# Patient Record
Sex: Male | Born: 1955 | Race: White | Hispanic: No | Marital: Married | Smoking: Never smoker
Health system: Southern US, Community
[De-identification: ages and names within clinical notes are randomized; demographics above are authoritative.]

## PROBLEM LIST (undated history)

## (undated) DIAGNOSIS — L570 Actinic keratosis: Secondary | ICD-10-CM

## (undated) HISTORY — DX: Actinic keratosis: L57.0

## (undated) HISTORY — PX: FRACTURE SURGERY: SHX138

## (undated) HISTORY — PX: VASECTOMY: SHX75

---

## 2009-02-20 ENCOUNTER — Emergency Department (HOSPITAL_COMMUNITY): Admission: EM | Admit: 2009-02-20 | Discharge: 2009-02-20 | Payer: Self-pay | Admitting: Family Medicine

## 2010-12-29 LAB — POCT I-STAT, CHEM 8
Creatinine, Ser: 1.1 mg/dL (ref 0.4–1.5)
Glucose, Bld: 131 mg/dL — ABNORMAL HIGH (ref 70–99)
Hemoglobin: 15 g/dL (ref 13.0–17.0)
TCO2: 27 mmol/L (ref 0–100)

## 2017-12-14 ENCOUNTER — Ambulatory Visit: Payer: Self-pay | Admitting: Family Medicine

## 2017-12-21 ENCOUNTER — Encounter: Payer: Self-pay | Admitting: Family Medicine

## 2017-12-21 ENCOUNTER — Ambulatory Visit (INDEPENDENT_AMBULATORY_CARE_PROVIDER_SITE_OTHER): Payer: Commercial Managed Care - PPO | Admitting: Family Medicine

## 2017-12-21 VITALS — BP 122/73 | HR 73 | Temp 97.5°F | Ht 72.0 in | Wt 185.2 lb

## 2017-12-21 DIAGNOSIS — Z1159 Encounter for screening for other viral diseases: Secondary | ICD-10-CM

## 2017-12-21 DIAGNOSIS — Z125 Encounter for screening for malignant neoplasm of prostate: Secondary | ICD-10-CM

## 2017-12-21 DIAGNOSIS — L570 Actinic keratosis: Secondary | ICD-10-CM | POA: Insufficient documentation

## 2017-12-21 DIAGNOSIS — Z1211 Encounter for screening for malignant neoplasm of colon: Secondary | ICD-10-CM | POA: Diagnosis not present

## 2017-12-21 DIAGNOSIS — Z13220 Encounter for screening for lipoid disorders: Secondary | ICD-10-CM

## 2017-12-21 DIAGNOSIS — Z7689 Persons encountering health services in other specified circumstances: Secondary | ICD-10-CM | POA: Diagnosis not present

## 2017-12-21 DIAGNOSIS — K594 Anal spasm: Secondary | ICD-10-CM

## 2017-12-21 DIAGNOSIS — Z13228 Encounter for screening for other metabolic disorders: Secondary | ICD-10-CM | POA: Diagnosis not present

## 2017-12-21 DIAGNOSIS — Z130 Encounter for screening for diseases of the blood and blood-forming organs and certain disorders involving the immune mechanism: Secondary | ICD-10-CM | POA: Diagnosis not present

## 2017-12-21 DIAGNOSIS — Z114 Encounter for screening for human immunodeficiency virus [HIV]: Secondary | ICD-10-CM

## 2017-12-21 NOTE — Patient Instructions (Addendum)
You had cryotherapy done today for your actinic keratosis.  You may need another treatment in the next couple of months if these persist.  Make sure to wear protective clothing/ head gear and sunscreen.  You had labs performed today.  You will be contacted with the results of the labs once they are available, usually in the next 3 days for routine lab work.  I have referred you to Gastroenterology for colonoscopy.   Actinic Keratosis An actinic keratosis is a precancerous growth on the skin. This means that it could develop into skin cancer if it is not treated. About 1% of these growths (actinic keratoses) turn into skin cancer within one year if they are not treated. It is important to have all of these growths evaluated to determine the best treatment approach. What are the causes? This condition is caused by getting too much ultraviolet (UV) radiation from the sun or other UV light sources. What increases the risk? The following factors may make you more likely to develop this condition:  Having light-colored skin and blue eyes.  Having blonde or red hair.  Spending a lot of time in the sun.  Inadequate skin protection when outdoors. This may include: ? Not using sunscreen properly. ? Not covering up skin that is exposed to sunlight.  Aging. The risk of developing an actinic keratosis increases with age.  What are the signs or symptoms? Actinic keratoses look like scaly, rough spots of skin.They can be as small as a pinhead or as big as a quarter. They may itch, hurt, or feel sensitive. In most cases, the growths become red. In some cases, they may be skin-colored, light tan, dark tan, pink, or a combination of any of these colors. There may be a small piece of pink or gray skin (skin tag) growing from the actinic keratosis. In some cases, it may be easier to notice actinic keratoses by feeling them, rather than seeing them. Actinic keratoses appear most often on areas of skin that  get a lot of sun exposure, including the scalp, face, ears, lips, upper back, forearms, and the backs of the hands. Sometimes, actinic keratoses disappear, but many reappear a few days to a few weeks later. How is this diagnosed? This condition is usually diagnosed with a physical exam. A tissue sample may be removed from the actinic keratosis and examined under a microscope (biopsy). How is this treated?  Treatment for this condition may include:  Scraping off the actinic keratosis (curettage).  Freezing the actinic keratosis with liquid nitrogen (cryosurgery). This causes the growth to eventually fall off the skin.  Applying medicated creams or gels to destroy the cells in the growth.  Applying chemicals to the actinic keratosis to make the outer layers of skin peel off (chemical peel).  Photodynamic therapy. In this procedure, medicated cream is applied to the actinic keratosis. This cream increases your skin's sensitivity to light. Then, a strong light is aimed at the actinic keratosis to destroy cells in the growth.  Follow these instructions at home: Skin care  Apply cool, wet cloths (cool compresses) to the affected areas.  Do not scratch your skin.  Check your skin regularly for any growths, especially growths that: ? Start to itch or bleed. ? Change in size, shape, or color. Caring for the treated area  Keep the treated area clean and dry as told by your health care provider.  Do not apply any medicine, cream, or lotion to the treated area unless your  health care provider tells you to do that.  Do not pick at blisters or try to break them open. This can cause infection and scarring.  If you have red or irritated skin after treatment, follow instructions from your health care provider about how to take care of the treated area. Make sure you: ? Wash your hands with soap and water before you change your bandage (dressing). If soap and water are not available, use hand  sanitizer. ? Change your dressing as told by your health care provider.  If you have red or irritated skin after treatment, check your treated area every day for signs of infection. Check for: ? Swelling, pain, or more redness. ? Fluid or blood. ? Warmth. ? Pus or a bad smell. General instructions  Take over-the-counter and prescription medicines only as told by your health care provider.  Return to your normal activities as told by your health care provider. Ask your health care provider what activities are safe for you.  Do not use any tobacco products, such as cigarettes, chewing tobacco, and e-cigarettes. If you need help quitting, ask your health care provider.  Have a skin exam done every year by a health care provider who is a skin conditions specialist (dermatologist).  Keep all follow-up visits as told by your health care provider. This is important. How is this prevented?  Do not get sunburns.  Try to avoid the sun between 10:00 a.m. and 4:00 p.m. This is when the UV light is the strongest.  Use a sunscreen or sunblock with SPF 30 (sun protection factor 30) or greater.  Apply sunscreen before you are exposed to sunlight, and reapply periodically as often as directed by the instructions on the sunscreen container.  Always wear sunglasses that have UV protection, and always wear hats and clothing to protect your skin from sunlight.  When possible, avoid medicines that increase your sensitivity to sunlight. These include: ? Certain antibiotic medicines. ? Certain water pills (diuretics). ? Certain prescription medicines that are used to treat acne (retinoids).  Do not use tanning beds or other indoor tanning devices. Contact a health care provider if:  You notice any changes or new growths on your skin.  You have swelling, pain, or more redness around your treated area.  You have fluid or blood coming from your treated area.  Your treated area feels warm to the  touch.  You have pus or a bad smell coming from your treated area.  You have a fever.  You have a blister that becomes large and painful. This information is not intended to replace advice given to you by your health care provider. Make sure you discuss any questions you have with your health care provider. Document Released: 12/04/2008 Document Revised: 05/08/2016 Document Reviewed: 05/18/2015 Elsevier Interactive Patient Education  2018 [employer].  Cryosurgery for Skin Conditions, Care After These instructions give you information on caring for yourself after your procedure. Your doctor may also give you more specific instructions. Call your doctor if you have any problems or questions after your procedure. Follow these instructions at home: Caring for the treated area  Follow instructions from your doctor about how to take care of the treated area. Make sure you: ? Keep the area covered with a bandage (dressing) until it heals, or for as long as told by your doctor. ? Wash your hands with soap and water before you change your bandage. If you do not have soap and water, use hand sanitizer. ?  Change your bandage as told by your doctor. ? Keep the bandage and the treated area clean and dry. If the bandage gets wet, change it right away. ? Clean the treated area with soap and water.  Check the treated area every day for signs of infection. Check for: ? More redness, swelling, or pain. ? More fluid or blood. ? Warmth. ? Pus or a bad smell. General instructions  Do not pick at your blister. Do not try to break it open. This can cause infection and scarring.  Do not put any medicine, cream, or lotion on the treated area unless told by your doctor.  Take over-the-counter and prescription medicines only as told by your doctor.  Keep all follow-up visits as told by your doctor. This is important. Contact a doctor if:  You have more redness, swelling, or pain around the treated  area.  You have more fluid or blood coming from the treated area.  The treated area feels warm to the touch.  You have pus or a bad smell coming from the treated area.  Your blister gets large and painful. Get help right away if:  You have a fever and have redness spreading from the treated area. Summary  You should keep the treated area and your bandage clean and dry.  Check the treated area every day for signs of infection. Signs include fluid, pus, warmth, or having more redness, swelling, or pain.  Do not pick at your blister. Do not try to break it open. This information is not intended to replace advice given to you by your health care provider. Make sure you discuss any questions you have with your health care provider. Document Released: 11/30/2011 Document Revised: 07/27/2016 Document Reviewed: 07/27/2016 Elsevier Interactive Patient Education  2017 [place] Maintenance, Male A healthy lifestyle and preventive care is important for your health and wellness. Ask your health care provider about what schedule of regular examinations is right for you. What should I know about weight and diet? Eat a Healthy Diet  Eat plenty of vegetables, fruits, whole grains, low-fat dairy products, and lean protein.  Do not eat a lot of foods high in solid fats, added sugars, or salt.  Maintain a Healthy Weight Regular exercise can help you achieve or maintain a healthy weight. You should:  Do at least 150 minutes of exercise each week. The exercise should increase your heart rate and make you sweat (moderate-intensity exercise).  Do strength-training exercises at least twice a week.  Watch Your Levels of Cholesterol and Blood Lipids  Have your blood tested for lipids and cholesterol every 5 years starting at 62 years of age. If you are at high risk for heart disease, you should start having your blood tested when you are 62 years old. You may need to have your cholesterol  levels checked more often if: ? Your lipid or cholesterol levels are high. ? You are older than 62 years of age. ? You are at high risk for heart disease.  What should I know about cancer screening? Many types of cancers can be detected early and may often be prevented. Lung Cancer  You should be screened every year for lung cancer if: ? You are a current smoker who has smoked for at least 30 years. ? You are a former smoker who has quit within the past 15 years.  Talk to your health care provider about your screening options, when you should start screening, and how often  you should be screened.  Colorectal Cancer  Routine colorectal cancer screening usually begins at 62 years of age and should be repeated every 5-10 years until you are 62 years old. You may need to be screened more often if early forms of precancerous polyps or small growths are found. Your health care provider may recommend screening at an earlier age if you have risk factors for colon cancer.  Your health care provider may recommend using home test kits to check for hidden blood in the stool.  A small camera at the end of a tube can be used to examine your colon (sigmoidoscopy or colonoscopy). This checks for the earliest forms of colorectal cancer.  Prostate and Testicular Cancer  Depending on your age and overall health, your health care provider may do certain tests to screen for prostate and testicular cancer.  Talk to your health care provider about any symptoms or concerns you have about testicular or prostate cancer.  Skin Cancer  Check your skin from head to toe regularly.  Tell your health care provider about any new moles or changes in moles, especially if: ? There is a change in a mole's size, shape, or color. ? You have a mole that is larger than a pencil eraser.  Always use sunscreen. Apply sunscreen liberally and repeat throughout the day.  Protect yourself by wearing long sleeves, pants, a  wide-brimmed hat, and sunglasses when outside.  What should I know about heart disease, diabetes, and high blood pressure?  If you are 56-20 years of age, have your blood pressure checked every 3-5 years. If you are 36 years of age or older, have your blood pressure checked every year. You should have your blood pressure measured twice-once when you are at a hospital or clinic, and once when you are not at a hospital or clinic. Record the average of the two measurements. To check your blood pressure when you are not at a hospital or clinic, you can use: ? An automated blood pressure machine at a pharmacy. ? A home blood pressure monitor.  Talk to your health care provider about your target blood pressure.  If you are between 65-2 years old, ask your health care provider if you should take aspirin to prevent heart disease.  Have regular diabetes screenings by checking your fasting blood sugar level. ? If you are at a normal weight and have a low risk for diabetes, have this test once every three years after the age of 55. ? If you are overweight and have a high risk for diabetes, consider being tested at a younger age or more often.  A one-time screening for abdominal aortic aneurysm (AAA) by ultrasound is recommended for men aged 28-75 years who are current or former smokers. What should I know about preventing infection? Hepatitis B If you have a higher risk for hepatitis B, you should be screened for this virus. Talk with your health care provider to find out if you are at risk for hepatitis B infection. Hepatitis C Blood testing is recommended for:  Everyone born from [birth year] through 1965.  Anyone with known risk factors for hepatitis C.  Sexually Transmitted Diseases (STDs)  You should be screened each year for STDs including gonorrhea and chlamydia if: ? You are sexually active and are younger than 62 years of age. ? You are older than 62 years of age and your health care provider  tells you that you are at risk for this type of infection. ?  Your sexual activity has changed since you were last screened and you are at an increased risk for chlamydia or gonorrhea. Ask your health care provider if you are at risk.  Talk with your health care provider about whether you are at high risk of being infected with HIV. Your health care provider may recommend a prescription medicine to help prevent HIV infection.  What else can I do?  Schedule regular health, dental, and eye exams.  Stay current with your vaccines (immunizations).  Do not use any tobacco products, such as cigarettes, chewing tobacco, and e-cigarettes. If you need help quitting, ask your health care provider.  Limit alcohol intake to no more than 2 drinks per day. One drink equals 12 ounces of beer, 5 ounces of wine, or 1 ounces of hard liquor.  Do not use street drugs.  Do not share needles.  Ask your health care provider for help if you need support or information about quitting drugs.  Tell your health care provider if you often feel depressed.  Tell your health care provider if you have ever been abused or do not feel safe at home. This information is not intended to replace advice given to you by your health care provider. Make sure you discuss any questions you have with your health care provider. Document Released: 03/05/2008 Document Revised: 05/06/2016 Document Reviewed: 06/11/2015 Elsevier Interactive Patient Education  [employer].

## 2017-12-21 NOTE — Progress Notes (Signed)
Subjective: VH:QIONGEXBM care, Actinic keratosis HPI: Jon Calderon is a 62 y.o. male presenting to clinic today for:  1. Actinic keratoses Patient reports long-standing history of actinic keratosis affecting bilateral temples, bilateral ears and bilateral upper extremities.  He has had dermatology freeze these off in the past.  He notes that new spots have popped up and he is interested in having these removed.  Denies history of melanoma.  No family history of melanoma or other skin cancers.  He notes that he has been quite a bit of time outside as he is a Administrator and a Psychologist, sport and exercise.  He does tend to wear protective clothing and sunscreen but continues to have these spots develop.  No unplanned weight loss, night sweats, spontaneous bleeding of lesions.  2. Rectal pain Patient reports that he had an episode of rectal pain that lasted no longer than 10 minutes and resolved on its own couple of weeks ago.  He notes that pain has not recurred.  Denies any pain with defecation.  No constipation, diarrhea, nausea, vomiting, melena, hematochezia, unplanned weight loss.  No personal or family history of colon cancers.  Patient is actually never had a screening colonoscopy before.  He is interested in getting one done and prefers to go to either [facility] or Dr. Olevia Perches in [place].  3. Prostate cancer screening Patient denies personal history of BPH.  No known family history of BPH or prostate cancer.  He denies nocturia, urinary frequency, difficulty starting a urinary stream, hematuria, low back pain, unplanned weight loss or night sweats.  He notes that he had a manual prostate exam many years ago and was noted that this was normal.  He has never had PSA checked before.  Denies recent ejaculation or sexual activity.   Past Medical History:  Diagnosis Date  . Actinic keratoses      Past Surgical History:  Procedure Laterality Date  . FRACTURE SURGERY     wrist, arm, ribs, foot  . VASECTOMY      Social History   Socioeconomic History  . Marital status: Single    Spouse name: Not on file  . Number of children: 1  . Years of education: Not on file  . Highest education level: Not on file  Occupational History  . Occupation: Owns trucking Systems analyst  Social Needs  . Financial resource strain: Not on file  . Food insecurity:    Worry: Not on file    Inability: Not on file  . Transportation needs:    Medical: Not on file    Non-medical: Not on file  Tobacco Use  . Smoking status: Never Smoker  . Smokeless tobacco: Never Used  Substance and Sexual Activity  . Alcohol use: Never    Frequency: Never  . Drug use: Never  . Sexual activity: Not Currently  Lifestyle  . Physical activity:    Days per week: Not on file    Minutes per session: Not on file  . Stress: Not on file  Relationships  . Social connections:    Talks on phone: Not on file    Gets together: Not on file    Attends religious service: Not on file    Active member of club or organization: Not on file    Attends meetings of clubs or organizations: Not on file    Relationship status: Not on file  . Intimate partner violence:    Fear of current or ex partner: Not on file  Emotionally abused: Not on file    Physically abused: Not on file    Forced sexual activity: Not on file  Other Topics Concern  . Not on file  Social History Narrative  . Not on file   No outpatient medications have been marked as taking for the 12/21/17 encounter (Office Visit) with Janora Norlander, DO.   Family History  Problem Relation Age of Onset  . Pancreatic cancer Mother 59  . Heart disease Father 41  . Healthy Sister   . Healthy Brother   . Healthy Son   . Cancer Maternal Grandfather   . Colon cancer Neg Hx   . Breast cancer Neg Hx   . Ovarian cancer Neg Hx   . Prostate cancer Neg Hx    Allergies  Allergen Reactions  . Penicillins      Health Maintenance: reports having had TDap  ROS: Per  HPI  Objective: Office vital signs reviewed. BP 122/73   Pulse 73   Temp (!) 97.5 F (36.4 C) (Oral)   Ht 6' (1.829 m)   Wt 185 lb 3.2 oz (84 kg)   BMI 25.12 kg/m   Physical Examination:  General: Awake, alert, well nourished, No acute distress HEENT: Normal    Neck: No masses palpated. No lymphadenopathy; no carotid bruits.  No JVD.    Ears: Tympanic membranes intact, normal light reflex, no erythema, no bulging    Eyes: PERRLA, extraocular movement in tact, sclera white; wears glasses    nose: nasal turbinates moist, no nasal discharge    Throat: moist mucus membranes, no erythema, no tonsillar exudate.  Airway is patent Cardio: regular rate and rhythm, S1S2 heard, no murmurs appreciated Pulm: clear to auscultation bilaterally, no wheezes, rhonchi or rales; normal work of breathing on room air GI: soft, non-tender, non-distended, bowel sounds present x4, no hepatomegaly, no splenomegaly, no masses Extremities: warm, well perfused, No edema, cyanosis or clubbing; +2 pulses bilaterally MSK: Normal gait and normal station Skin: Multiple hyperkeratotic lesions noted along the left and right temples.  There are singular discrete lesions noted at the apex of bilateral helix.  He also has a 2 mm x 2 mm lesion along the left upper forearm and a 2.5 mm x by 2.5 mm lesion lower on the left forearm.  Right forearm with a 2 mm x 2 mm lesion.  No associated erythema, induration or fluctuance.  No exudate or bleeding. Neuro: Follows all commands.  No focal neurologic deficits. Psych: Mood stable, speech normal, affect appropriate, pleasant, engaging    Office Visit from 12/21/2017 in [place]  PHQ-2 Total Score  0     Cryotherapy Procedure:  Risks and benefits of procedure were reviewed with the patient.  Written consent obtained and scanned into the chart.  Lesion of concern was identified and located on right temple.  Liquid nitrogen was applied to area of concern  and extending out 2.5 millimeters beyond the border of the lesion.  Treated area was allowed to come back to room temperature before treating it a second time.  Patient tolerated procedure well and there were no immediate complications.  Home care instructions were reviewed with the patient and a handout was provided.  Cryotherapy Procedure:  Risks and benefits of procedure were reviewed with the patient.  Written consent obtained and scanned into the chart.  Lesion of concern was identified and located on left temple.  Liquid nitrogen was applied to area of concern and extending out 2.5  millimeters beyond the border of the lesion.  Treated area was allowed to come back to room temperature before treating it a second time.  Patient tolerated procedure well and there were no immediate complications.  Home care instructions were reviewed with the patient and a handout was provided.  Cryotherapy Procedure:  Risks and benefits of procedure were reviewed with the patient.  Written consent obtained and scanned into the chart.  Lesion of concern was identified and located on right helix.  Liquid nitrogen was applied to area of concern and extending out 1.5 millimeters beyond the border of the lesion.  Treated area was allowed to come back to room temperature before treating it a second time.  Patient tolerated procedure well and there were no immediate complications.  Home care instructions were reviewed with the patient and a handout was provided.  Cryotherapy Procedure:  Risks and benefits of procedure were reviewed with the patient.  Written consent obtained and scanned into the chart.  Lesion of concern was identified and located on left helix.  Liquid nitrogen was applied to area of concern and extending out 1.5 millimeters beyond the border of the lesion.  Treated area was allowed to come back to room temperature before treating it a second time.  Patient tolerated procedure well and there were no  immediate complications.  Home care instructions were reviewed with the patient and a handout was provided.  Cryotherapy Procedure:  Risks and benefits of procedure were reviewed with the patient.  Written consent obtained and scanned into the chart.  Lesion of concern was identified and located on right forearm.  Liquid nitrogen was applied to area of concern and extending out 2.5 millimeters beyond the border of the lesion.  Treated area was allowed to come back to room temperature before treating it a second time.  Patient tolerated procedure well and there were no immediate complications.  Home care instructions were reviewed with the patient and a handout was provided.  Cryotherapy Procedure:  Risks and benefits of procedure were reviewed with the patient.  Written consent obtained and scanned into the chart.  Lesion of concern was identified and located on left forearm x 2.  Liquid nitrogen was applied to area of concern and extending out 2.5 millimeters beyond the border of each lesion.  Treated area was allowed to come back to room temperature before treating it a second time.  Patient tolerated procedure well and there were no immediate complications.  Home care instructions were reviewed with the patient and a handout was provided.  Assessment/ Plan: 62 y.o. male   1. Actinic keratosis of right temple Treated with cryotherapy see above procedure note.  Patient tolerated procedure well.  Home care instructions were reviewed and handout was provided.  Follow-up as needed.  2. Establishing care with new doctor, encounter for  3. Actinic keratosis of left temple Cryotherapy as above.  4. Multiple actinic keratoses Cryotherapy as above.  5. Screening for lipid disorders - Lipid Panel  6. Screening for metabolic disorder - [id number]  7. Screening for malignant neoplasm of prostate No internal prostate exam performed today as patient is asymptomatic.  Will check PSA. - PSA  8.  Screening for deficiency anemia - CBC with Differential  9. Screening for HIV (human immunodeficiency virus) - HIV antibody  10. Encounter for hepatitis C screening test for low risk patient - Hepatitis C antibody  11. Screening for malignant neoplasm of colon - Ambulatory referral to Gastroenterology  12. Proctalgia fugax Reassurance.  No interventions needed.   Janora Norlander, DO [place] [phone]

## 2017-12-22 LAB — CMP14+EGFR
ALK PHOS: 79 IU/L (ref 39–117)
ALT: 19 IU/L (ref 0–44)
AST: 21 IU/L (ref 0–40)
Albumin/Globulin Ratio: 1.5 (ref 1.2–2.2)
Albumin: 4.4 g/dL (ref 3.6–4.8)
BUN/Creatinine Ratio: 22 (ref 10–24)
BUN: 20 mg/dL (ref 8–27)
Bilirubin Total: 0.4 mg/dL (ref 0.0–1.2)
CALCIUM: 9.7 mg/dL (ref 8.6–10.2)
CO2: 24 mmol/L (ref 20–29)
Chloride: 100 mmol/L (ref 96–106)
Creatinine, Ser: 0.89 mg/dL (ref 0.76–1.27)
GFR calc Af Amer: 107 mL/min/{1.73_m2} (ref >59)
GFR, EST NON AFRICAN AMERICAN: 92 mL/min/{1.73_m2} (ref >59)
GLOBULIN, TOTAL: 3 g/dL (ref 1.5–4.5)
Glucose: 94 mg/dL (ref 65–99)
POTASSIUM: 4.5 mmol/L (ref 3.5–5.2)
SODIUM: 139 mmol/L (ref 134–144)
Total Protein: 7.4 g/dL (ref 6.0–8.5)

## 2017-12-22 LAB — CBC WITH DIFFERENTIAL/PLATELET
BASOS ABS: 0 10*3/uL (ref 0.0–0.2)
BASOS: 0 %
EOS (ABSOLUTE): 0.1 10*3/uL (ref 0.0–0.4)
Eos: 1 %
Hematocrit: 42.6 % (ref 37.5–51.0)
Hemoglobin: 15.1 g/dL (ref 13.0–17.7)
IMMATURE GRANS (ABS): 0 10*3/uL (ref 0.0–0.1)
IMMATURE GRANULOCYTES: 0 %
LYMPHS: 22 %
Lymphocytes Absolute: 1.6 10*3/uL (ref 0.7–3.1)
MCH: 31.9 pg (ref 26.6–33.0)
MCHC: 35.4 g/dL (ref 31.5–35.7)
MCV: 90 fL (ref 79–97)
Monocytes Absolute: 0.5 10*3/uL (ref 0.1–0.9)
Monocytes: 7 %
Neutrophils Absolute: 5.2 10*3/uL (ref 1.4–7.0)
Neutrophils: 70 %
PLATELETS: 289 10*3/uL (ref 150–379)
RBC: 4.74 x10E6/uL (ref 4.14–5.80)
RDW: 13.2 % (ref 12.3–15.4)
WBC: 7.4 10*3/uL (ref 3.4–10.8)

## 2017-12-22 LAB — LIPID PANEL
CHOL/HDL RATIO: 4.1 ratio (ref 0.0–5.0)
Cholesterol, Total: 210 mg/dL — ABNORMAL HIGH (ref 100–199)
HDL: 51 mg/dL (ref >39)
LDL Calculated: 150 mg/dL — ABNORMAL HIGH (ref 0–99)
TRIGLYCERIDES: 44 mg/dL (ref 0–149)
VLDL Cholesterol Cal: 9 mg/dL (ref 5–40)

## 2017-12-22 LAB — HEPATITIS C ANTIBODY: Hep C Virus Ab: <0.1 s/co ratio (ref 0.0–0.9)

## 2017-12-22 LAB — HIV ANTIBODY (ROUTINE TESTING W REFLEX): HIV SCREEN 4TH GENERATION: NONREACTIVE

## 2017-12-22 LAB — PSA: Prostate Specific Ag, Serum: 2.6 ng/mL (ref 0.0–4.0)

## 2018-01-31 ENCOUNTER — Ambulatory Visit (INDEPENDENT_AMBULATORY_CARE_PROVIDER_SITE_OTHER): Payer: Self-pay

## 2018-01-31 DIAGNOSIS — Z1211 Encounter for screening for malignant neoplasm of colon: Secondary | ICD-10-CM

## 2018-01-31 MED ORDER — NA SULFATE-K SULFATE-MG SULF 17.5-3.13-1.6 GM/177ML PO SOLN: 1.0000 | ORAL | 0 refills | Status: DC

## 2018-01-31 NOTE — Progress Notes (Addendum)
Gastroenterology Pre-Procedure Review  Request Date:01/31/18 Requesting Physician: Ronnie Doss DO at [facility] ( no previous tcs)  PATIENT REVIEW QUESTIONS: The patient responded to the following health history questions as indicated:    1. Diabetes Melitis: no 2. Joint replacements in the past 12 months: no 3. Major health problems in the past 3 months: no 4. Has an artificial valve or MVP: no 5. Has a defibrillator: no 6. Has been advised in past to take antibiotics in advance of a procedure like teeth cleaning: no 7. Family history of colon cancer: no  8. Alcohol Use: no 9. History of sleep apnea: no  10. History of coronary artery or other vascular stents placed within the last 12 months: no 11. History of any prior anesthesia complications: no    MEDICATIONS & ALLERGIES:    Patient reports the following regarding taking any blood thinners:   Plavix? no Aspirin? no Coumadin? no Brilinta? no Xarelto? no Eliquis? no Pradaxa? no Savaysa? no Effient? no  Patient confirms/reports the following medications:  No current outpatient medications on file.   No current facility-administered medications for this visit.     Patient confirms/reports the following allergies:  Allergies  Allergen Reactions  . Penicillins     No orders of the defined types were placed in this encounter.   AUTHORIZATION INFORMATION Primary Insurance: [place],  [place] #: [id number],  Group #: [id number] Pre-Cert / Josem Kaufmann required: no per [facility] at [facility] / Auth #: reference # [id number]   SCHEDULE INFORMATION: Procedure has been scheduled as follows:  Date: 02/28/18, Time: 12:00  Location: [facility]  This Gastroenterology Pre-Precedure Review Form is being routed to the following provider(s): Roseanne Kaufman NP

## 2018-01-31 NOTE — Patient Instructions (Signed)
Jon Calderon  October 26, 1955 MRN: [id number]     Procedure Date: 02/28/18 Time to register: 11:00am Place to register: [facility] Procedure Time: 12:00 Scheduled provider: Barney Drain, MD    PREPARATION FOR COLONOSCOPY WITH SUPREP BOWEL PREP KIT  Note: Suprep Bowel Prep Kit is a split-dose (2day) regimen. Consumption of BOTH 6-ounce bottles is required for a complete prep.  Please notify us immediately if you are diabetic, take iron supplements, or if you are on Coumadin or any other blood thinners.                                                                                                                                                     1 DAY BEFORE PROCEDURE:  DATE: 02/27/18   DAY: Sunday  clear liquids the entire day - NO SOLID FOOD.     At 6:00pm: Complete steps 1 through 4 below, using ONE (1) 6-ounce bottle, before going to bed. Step 1:  Pour ONE (1) 6-ounce bottle of SUPREP liquid into the mixing container.  Step 2:  Add cool drinking water to the 16 ounce line on the container and mix.  Note: Dilute the solution concentrate as directed prior to use. Step 3:  DRINK ALL the liquid in the container. Step 4:  You MUST drink an additional two (2) or more 16 ounce containers of water over the next one (1) hour.   Continue clear liquids only, until midnight. Do not eat or drink anything after midnight. EXCEPTION: If you take medications for your heart, blood pressure, or breathing, you may take these medications with a small amount of clear liquid.   DAY OF PROCEDURE:   DATE: 02/28/18   DAY: Monday    5 hours before your procedure at :7:00am Step 1:  Pour ONE (1) 6-ounce bottle of SUPREP liquid into the mixing container.  Step 2:  Add cool drinking water to the 16 ounce line on the container and mix.  Note: Dilute the solution concentrate as directed prior to use. Step 3:  DRINK ALL the liquid in the container. Step 4:  You MUST drink an additional two (2)  or more 16 ounce containers of water over the next one (1) hour. You MUST complete the final glass of water at least 3 hours before your colonoscopy.   Nothing by mouth past: 9:00am  You may take your morning medications with sip of water unless we have instructed otherwise.    Please see below for Dietary Information.  CLEAR LIQUIDS INCLUDE:  Water Jello (NOT red in color)   Ice Popsicles (NOT red in color)   Tea (sugar ok, no milk/cream) Powdered fruit flavored drinks  Coffee (sugar ok, no milk/cream) Gatorade/ Lemonade/ Kool-Aid  (NOT red in color)   Juice: apple, white grape, white cranberry Soft drinks  Clear  bullion, consomme, broth (fat free beef/chicken/vegetable)  Carbonated beverages (any kind)  Strained chicken noodle soup Hard Candy   Remember: Clear liquids are liquids that will allow you to see your fingers on the other side of a clear glass. Be sure liquids are NOT red in color, and not cloudy, but CLEAR.  DO NOT EAT OR DRINK ANY OF THE FOLLOWING:  Dairy products of any kind   Cranberry juice Tomato juice / V8 juice   Grapefruit juice Orange juice     Red grape juice  Do not eat any solid foods, including such foods as: cereal, oatmeal, yogurt, fruits, vegetables, creamed soups, eggs, bread, crackers, pureed foods in a blender, etc.   HELPFUL HINTS FOR DRINKING PREP SOLUTION:   Make sure prep is extremely cold. Mix and refrigerate the the morning of the prep. You may also put in the freezer.   You may try mixing some Crystal Light or Country Time Lemonade if you prefer. Mix in small amounts; add more if necessary.  Try drinking through a straw  Rinse mouth with water or a mouthwash between glasses, to remove after-taste.  Try sipping on a cold beverage /ice/ popsicles between glasses of prep.  Place a piece of sugar-free hard candy in mouth between glasses.  If you become nauseated, try consuming smaller amounts, or stretch out the time between glasses. Stop  for 30-60 minutes, then slowly start back drinking.     OTHER INSTRUCTIONS  You will need a responsible adult at least 62 years of age to accompany you and drive you home. This person must remain in the waiting room during your procedure. The hospital will cancel your procedure if you do not have a responsible adult with you.   1. Wear loose fitting clothing that is easily removed. 2. Leave jewelry and other valuables at home.  3. Remove all body piercing jewelry and leave at home. 4. Total time from sign-in until discharge is approximately 2-3 hours. 5. You should go home directly after your procedure and rest. You can resume normal activities the day after your procedure. 6. The day of your procedure you should not:  Drive  Make legal decisions  Operate machinery  Drink alcohol  Return to work   You may call the office (Dept: [phone]) before 5:00pm, or page the doctor on call [phone]) after 5:00pm, for further instructions, if necessary.   Insurance Information YOU WILL NEED TO CHECK WITH YOUR INSURANCE COMPANY FOR THE BENEFITS OF COVERAGE YOU HAVE FOR THIS PROCEDURE.  UNFORTUNATELY, NOT ALL INSURANCE COMPANIES HAVE BENEFITS TO COVER ALL OR PART OF THESE TYPES OF PROCEDURES.  IT IS YOUR RESPONSIBILITY TO CHECK YOUR BENEFITS, HOWEVER, WE WILL BE GLAD TO ASSIST YOU WITH ANY CODES YOUR INSURANCE COMPANY MAY NEED.    PLEASE NOTE THAT MOST INSURANCE COMPANIES WILL NOT COVER A SCREENING COLONOSCOPY FOR PEOPLE UNDER THE AGE OF 50  IF YOU HAVE BCBS INSURANCE, YOU MAY HAVE BENEFITS FOR A SCREENING COLONOSCOPY BUT IF POLYPS ARE FOUND THE DIAGNOSIS WILL CHANGE AND THEN YOU MAY HAVE A DEDUCTIBLE THAT WILL NEED TO BE MET. SO PLEASE MAKE SURE YOU CHECK YOUR BENEFITS FOR A SCREENING COLONOSCOPY AS WELL AS A DIAGNOSTIC COLONOSCOPY.

## 2018-02-07 NOTE — Progress Notes (Signed)
Appropriate.

## 2018-02-28 ENCOUNTER — Ambulatory Visit (HOSPITAL_COMMUNITY)
Admission: RE | Admit: 2018-02-28 | Discharge: 2018-02-28 | Disposition: A | Discharge status: Home / Self Care | Payer: Commercial Managed Care - PPO | Source: Ambulatory Visit | Attending: Gastroenterology | Admitting: Gastroenterology

## 2018-02-28 ENCOUNTER — Encounter (HOSPITAL_COMMUNITY): Payer: Self-pay | Admitting: *Deleted

## 2018-02-28 ENCOUNTER — Other Ambulatory Visit: Payer: Self-pay

## 2018-02-28 ENCOUNTER — Encounter (HOSPITAL_COMMUNITY)
Admission: RE | Disposition: A | Discharge status: Home / Self Care | Payer: Self-pay | Source: Ambulatory Visit | Attending: Gastroenterology

## 2018-02-28 DIAGNOSIS — K644 Residual hemorrhoidal skin tags: Secondary | ICD-10-CM | POA: Insufficient documentation

## 2018-02-28 DIAGNOSIS — Z8249 Family history of ischemic heart disease and other diseases of the circulatory system: Secondary | ICD-10-CM | POA: Diagnosis not present

## 2018-02-28 DIAGNOSIS — Z800 Family history of malignant neoplasm of digestive organs: Secondary | ICD-10-CM | POA: Insufficient documentation

## 2018-02-28 DIAGNOSIS — K648 Other hemorrhoids: Secondary | ICD-10-CM | POA: Diagnosis not present

## 2018-02-28 DIAGNOSIS — D123 Benign neoplasm of transverse colon: Secondary | ICD-10-CM | POA: Insufficient documentation

## 2018-02-28 DIAGNOSIS — Z1211 Encounter for screening for malignant neoplasm of colon: Secondary | ICD-10-CM | POA: Diagnosis not present

## 2018-02-28 DIAGNOSIS — Q438 Other specified congenital malformations of intestine: Secondary | ICD-10-CM | POA: Diagnosis not present

## 2018-02-28 HISTORY — PX: COLONOSCOPY: SHX5424

## 2018-02-28 SURGERY — COLONOSCOPY
Anesthesia: Moderate Sedation

## 2018-02-28 MED ORDER — MIDAZOLAM HCL 5 MG/5ML IJ SOLN
INTRAMUSCULAR | Status: AC
  Filled 2018-02-28: qty 10

## 2018-02-28 MED ORDER — MEPERIDINE HCL 100 MG/ML IJ SOLN
INTRAMUSCULAR | Status: DC | PRN
  Administered 2018-02-28 (×3): 25 mg via INTRAVENOUS

## 2018-02-28 MED ORDER — MEPERIDINE HCL 100 MG/ML IJ SOLN
INTRAMUSCULAR | Status: AC
  Filled 2018-02-28: qty 2

## 2018-02-28 MED ORDER — SODIUM CHLORIDE 0.9 % IV SOLN
INTRAVENOUS | Status: DC
  Administered 2018-02-28: 11:00:00 via INTRAVENOUS

## 2018-02-28 MED ORDER — SIMETHICONE 40 MG/0.6ML PO SUSP
ORAL | Status: DC | PRN
  Administered 2018-02-28: 2.5 mL

## 2018-02-28 MED ORDER — MIDAZOLAM HCL 5 MG/5ML IJ SOLN
INTRAMUSCULAR | Status: DC | PRN
  Administered 2018-02-28 (×3): 2 mg via INTRAVENOUS

## 2018-02-28 NOTE — Op Note (Signed)
[facility] Patient Name: Jon Calderon Procedure Date: 02/28/2018 11:38 AM MRN: [id number] Date of Birth: Dec 16, 1955 Attending MD: Barney Drain MD, MD CSN: [id number] Age: 62 Admit Type: Outpatient Procedure:                Colonoscopy WITH COLD SNARE POLYPECTOMY Indications:              Screening for colorectal malignant neoplasm Providers:                Barney Drain MD, MD, Lurline Del, RN, Nelma Rothman,                            Technician Referring MD:             Koleen Distance. Gottschalk Medicines:                Meperidine 75 mg IV, Midazolam 6 mg IV Complications:            No immediate complications. Estimated Blood Loss:     Estimated blood loss was minimal. Procedure:                Pre-Anesthesia Assessment:                           - Prior to the procedure, a History and Physical                            was performed, and patient medications and                            allergies were reviewed. The patient's tolerance of                            previous anesthesia was also reviewed. The risks                            and benefits of the procedure and the sedation                            options and risks were discussed with the patient.                            All questions were answered, and informed consent                            was obtained. Prior Anticoagulants: The patient has                            taken no previous anticoagulant or antiplatelet                            agents. ASA Grade Assessment: I - A normal, healthy                            patient. After reviewing the risks and benefits,  the patient was deemed in satisfactory condition to                            undergo the procedure. After obtaining informed                            consent, the colonoscope was passed under direct                            vision. Throughout the procedure, the patient's                            blood pressure,  pulse, and oxygen saturations were                            monitored continuously. The EC-[id number] ([id number])                            scope was introduced through the anus and advanced                            to the the cecum, identified by appendiceal orifice                            and ileocecal valve. The colonoscopy was somewhat                            difficult due to a tortuous colon. Successful                            completion of the procedure was aided by                            straightening and shortening the scope to obtain                            bowel loop reduction. The patient tolerated the                            procedure well. The quality of the bowel                            preparation was excellent. The ileocecal valve,                            appendiceal orifice, and rectum were photographed. Scope In: 12:02:26 PM Scope Out: 12:14:25 PM Scope Withdrawal Time: 0 hours 9 minutes 56 seconds  Total Procedure Duration: 0 hours 11 minutes 59 seconds  Findings:      A 5 mm polyp was found in the hepatic flexure. The polyp was sessile.       The polyp was removed with a cold snare. Resection and retrieval were       complete.      External and internal hemorrhoids were found. The  hemorrhoids were       moderate.      The recto-sigmoid colon and sigmoid colon were mildly redundant. Impression:               - One 5 mm polyp at the hepatic flexure, removed                            with a cold snare. Resected and retrieved.                           - External and internal hemorrhoids.                           - Redundant LEFT colon. Moderate Sedation:      Moderate (conscious) sedation was administered by the endoscopy nurse       and supervised by the endoscopist. The following parameters were       monitored: oxygen saturation, heart rate, blood pressure, and response       to care. Total physician intraservice time was 38  minutes. Recommendation:           - High fiber diet.                           - Continue present medications.                           - Await pathology results.                           - Repeat colonoscopy in 5-10 years for surveillance.                           - Patient has a contact number available for                            emergencies. The signs and symptoms of potential                            delayed complications were discussed with the                            patient. Return to normal activities tomorrow.                            Written discharge instructions were provided to the                            patient. Procedure Code(s):        --- Professional ---                           [phone], Colonoscopy, flexible; with removal of                            tumor(s), polyp(s), or other lesion(s) by snare  technique                           G0500, Moderate sedation services provided by the                            same physician or other qualified health care                            professional performing a gastrointestinal                            endoscopic service that sedation supports,                            requiring the presence of an independent trained                            observer to assist in the monitoring of the                            patient's level of consciousness and physiological                            status; initial 15 minutes of intra-service time;                            patient age 71 years or older (additional time may                            be reported with [phone], as appropriate)                           [phone], Moderate sedation services provided by the                            same physician or other qualified health care                            professional performing the diagnostic or                            therapeutic service that the sedation supports,                             requiring the presence of an independent trained                            observer to assist in the monitoring of the                            patient's level of consciousness and physiological                            status; each  additional 15 minutes intraservice                            time (List separately in addition to code for                            primary service)                           [phone], Moderate sedation services provided by the                            same physician or other qualified health care                            professional performing the diagnostic or                            therapeutic service that the sedation supports,                            requiring the presence of an independent trained                            observer to assist in the monitoring of the                            patient's level of consciousness and physiological                            status; each additional 15 minutes intraservice                            time (List separately in addition to code for                            primary service) Diagnosis Code(s):        --- Professional ---                           Z12.11, Encounter for screening for malignant                            neoplasm of colon                           D12.3, Benign neoplasm of transverse colon (hepatic                            flexure or splenic flexure)                           K64.8, Other hemorrhoids                           Q43.8, Other specified congenital malformations of  intestine CPT copyright 2017 [employer]. All rights reserved. The codes documented in this report are preliminary and upon coder review may  be revised to meet current compliance requirements. Barney Drain, MD Barney Drain MD, MD 02/28/2018 12:33:12 PM This report has been signed electronically. Number of Addenda: 0

## 2018-02-28 NOTE — Discharge Instructions (Signed)
You had 1 small polyp removed. You have MODERATE EXTERNAL hemorrhoids.   DRINK WATER TO KEEP YOUR URINE LIGHT YELLOW.  FOLLOW A HIGH FIBER DIET. AVOID ITEMS THAT CAUSE BLOATING & GAS. SEE INFO BELOW.  YOUR BIOPSY RESULTS WILL BE AVAILABLE IN 7 DAYS.   Next colonoscopy BETWEEN 2024-2026.    Colonoscopy Care After Read the instructions outlined below and refer to this sheet in the next week. These discharge instructions provide you with general information on caring for yourself after you leave the hospital. While your treatment has been planned according to the most current medical practices available, unavoidable complications occasionally occur. If you have any problems or questions after discharge, call DR. Mariamawit Depaoli, [phone].  ACTIVITY  You may resume your regular activity, but move at a slower pace for the next 24 hours.   Take frequent rest periods for the next 24 hours.   Walking will help get rid of the air and reduce the bloated feeling in your belly (abdomen).   No driving for 24 hours (because of the medicine (anesthesia) used during the test).   You may shower.   Do not sign any important legal documents or operate any machinery for 24 hours (because of the anesthesia used during the test).    NUTRITION  Drink plenty of fluids.   You may resume your normal diet as instructed by your doctor.   Begin with a light meal and progress to your normal diet. Heavy or fried foods are harder to digest and may make you feel sick to your stomach (nauseated).   Avoid alcoholic beverages for 24 hours or as instructed.    MEDICATIONS  You may resume your normal medications.   WHAT YOU CAN EXPECT TODAY  Some feelings of bloating in the abdomen.   Passage of more gas than usual.   Spotting of blood in your stool or on the toilet paper  .  IF YOU HAD POLYPS REMOVED DURING THE COLONOSCOPY:  Eat a soft diet IF YOU HAVE NAUSEA, BLOATING, ABDOMINAL PAIN, OR  VOMITING.    FINDING OUT THE RESULTS OF YOUR TEST Not all test results are available during your visit. DR. Oneida Alar WILL CALL YOU WITHIN 14 DAYS OF YOUR PROCEDUE WITH YOUR RESULTS. Do not assume everything is normal if you have not heard from DR. Baltazar Pekala, CALL HER OFFICE AT [phone].  SEEK IMMEDIATE MEDICAL ATTENTION AND CALL THE OFFICE: [phone] IF:  You have more than a spotting of blood in your stool.   Your belly is swollen (abdominal distention).   You are nauseated or vomiting.   You have a temperature over 101F.   You have abdominal pain or discomfort that is severe or gets worse throughout the day.   High-Fiber Diet A high-fiber diet changes your normal diet to include more whole grains, legumes, fruits, and vegetables. Changes in the diet involve replacing refined carbohydrates with unrefined foods. The calorie level of the diet is essentially unchanged. The Dietary Reference Intake (recommended amount) for adult males is 38 grams per day. For adult females, it is 25 grams per day. Pregnant and lactating women should consume 28 grams of fiber per day. Fiber is the intact part of a plant that is not broken down during digestion. Functional fiber is fiber that has been isolated from the plant to provide a beneficial effect in the body. PURPOSE  Increase stool bulk.   Ease and regulate bowel movements.   Lower cholesterol.   REDUCE RISK OF COLON CANCER  INDICATIONS THAT YOU NEED MORE FIBER  Constipation and hemorrhoids.   Uncomplicated diverticulosis (intestine condition) and irritable bowel syndrome.   Weight management.   As a protective measure against hardening of the arteries (atherosclerosis), diabetes, and cancer.   GUIDELINES FOR INCREASING FIBER IN THE DIET  Start adding fiber to the diet slowly. A gradual increase of about 5 more grams (2 slices of whole-wheat bread, 2 servings of most fruits or vegetables, or 1 bowl of high-fiber cereal) per day is  best. Too rapid an increase in fiber may result in constipation, flatulence, and bloating.   Drink enough water and fluids to keep your urine clear or pale yellow. Water, juice, or caffeine-free drinks are recommended. Not drinking enough fluid may cause constipation.   Eat a variety of high-fiber foods rather than one type of fiber.   Try to increase your intake of fiber through using high-fiber foods rather than fiber pills or supplements that contain small amounts of fiber.   The goal is to change the types of food eaten. Do not supplement your present diet with high-fiber foods, but replace foods in your present diet.   INCLUDE A VARIETY OF FIBER SOURCES  Replace refined and processed grains with whole grains, canned fruits with fresh fruits, and incorporate other fiber sources. White rice, white breads, and most bakery goods contain little or no fiber.   Brown whole-grain rice, buckwheat oats, and many fruits and vegetables are all good sources of fiber. These include: broccoli, Brussels sprouts, cabbage, cauliflower, beets, sweet potatoes, white potatoes (skin on), carrots, tomatoes, eggplant, squash, berries, fresh fruits, and dried fruits.   Cereals appear to be the richest source of fiber. Cereal fiber is found in whole grains and bran. Bran is the fiber-rich outer coat of cereal grain, which is largely removed in refining. In whole-grain cereals, the bran remains. In breakfast cereals, the largest amount of fiber is found in those with "bran" in their names. The fiber content is sometimes indicated on the label.   You may need to include additional fruits and vegetables each day.   In baking, for 1 cup white flour, you may use the following substitutions:   1 cup whole-wheat flour minus 2 tablespoons.   1/2 cup white flour plus 1/2 cup whole-wheat flour.   Polyps, Colon  A polyp is extra tissue that grows inside your body. Colon polyps grow in the large intestine. The large  intestine, also called the colon, is part of your digestive system. It is a long, hollow tube at the end of your digestive tract where your body makes and stores stool. Most polyps are not dangerous. They are benign. This means they are not cancerous. But over time, some types of polyps can turn into cancer. Polyps that are smaller than a pea are usually not harmful. But larger polyps could someday become or may already be cancerous. To be safe, doctors remove all polyps and test them.   WHO GETS POLYPS? Anyone can get polyps, but certain people are more likely than others. You may have a greater chance of getting polyps if:  You are over 50.   You have had polyps before.   Someone in your family has had polyps.   Someone in your family has had cancer of the large intestine.   Find out if someone in your family has had polyps. You may also be more likely to get polyps if you:   Eat a lot of fatty foods  Smoke   Drink alcohol   Do not exercise  Eat too much   PREVENTION There is not one sure way to prevent polyps. You might be able to lower your risk of getting them if you:  Eat more fruits and vegetables and less fatty food.   Do not smoke.   Avoid alcohol.   Exercise every day.   Lose weight if you are overweight.   Eating more calcium and folate can also lower your risk of getting polyps. Some foods that are rich in calcium are milk, cheese, and broccoli. Some foods that are rich in folate are chickpeas, kidney beans, and spinach.   Hemorrhoids Hemorrhoids are dilated (enlarged) veins around the rectum. Sometimes clots will form in the veins. This makes them swollen and painful. These are called thrombosed hemorrhoids. Causes of hemorrhoids include:  Constipation.   Straining to have a bowel movement.   HEAVY LIFTING  HOME CARE INSTRUCTIONS  Eat a well balanced diet and drink 6 to 8 glasses of water every day to avoid constipation. You may also use a bulk laxative.    Avoid straining to have bowel movements.   Keep anal area dry and clean.   Do not use a donut shaped pillow or sit on the toilet for long periods. This increases blood pooling and pain.   Move your bowels when your body has the urge; this will require less straining and will decrease pain and pressure.

## 2018-02-28 NOTE — H&P (Addendum)
Primary Care Physician:  Janora Norlander, DO Primary Gastroenterologist:  Dr. Oneida Alar  Pre-Procedure History & Physical: HPI:  Jon Calderon is a 62 y.o. male here for COLON CANCER SCREENING.  Past Medical History:  Diagnosis Date  . Actinic keratoses     Past Surgical History:  Procedure Laterality Date  . FRACTURE SURGERY     wrist, arm, ribs, foot  . VASECTOMY      Prior to Admission medications   Medication Sig Start Date End Date Taking? Authorizing Provider  Na Sulfate-K Sulfate-Mg Sulf (SUPREP BOWEL PREP KIT) 17.5-3.13-1.6 GM/177ML SOLN Take 1 kit by mouth as directed. 01/31/18  Yes Annitta Needs, NP    Allergies as of 01/31/2018 - Review Complete 01/31/2018  Allergen Reaction Noted  . Penicillins  12/21/2017    Family History  Problem Relation Age of Onset  . Pancreatic cancer Mother 65  . Heart disease Father 68  . Healthy Sister   . Healthy Brother   . Healthy Son   . Cancer Maternal Grandfather   . Colon cancer Neg Hx   . Breast cancer Neg Hx   . Ovarian cancer Neg Hx   . Prostate cancer Neg Hx     Social History   Socioeconomic History  . Marital status: Single    Spouse name: Not on file  . Number of children: 1  . Years of education: Not on file  . Highest education level: Not on file  Occupational History  . Occupation: Owns trucking Systems analyst  Social Needs  . Financial resource strain: Not on file  . Food insecurity:    Worry: Not on file    Inability: Not on file  . Transportation needs:    Medical: Not on file    Non-medical: Not on file  Tobacco Use  . Smoking status: Never Smoker  . Smokeless tobacco: Never Used  Substance and Sexual Activity  . Alcohol use: Never    Frequency: Never  . Drug use: Never  . Sexual activity: Not Currently  Lifestyle  . Physical activity:    Days per week: Not on file    Minutes per session: Not on file  . Stress: Not on file  Relationships  . Social connections:    Talks on  phone: Not on file    Gets together: Not on file    Attends religious service: Not on file    Active member of club or organization: Not on file    Attends meetings of clubs or organizations: Not on file    Relationship status: Not on file  . Intimate partner violence:    Fear of current or ex partner: Not on file    Emotionally abused: Not on file    Physically abused: Not on file    Forced sexual activity: Not on file  Other Topics Concern  . Not on file  Social History Narrative  . Not on file    Review of Systems: See HPI, otherwise negative ROS   Physical Exam: BP 121/85   Pulse 67   Temp 98.8 F (37.1 C) (Oral)   Resp 12   Ht 6' (1.829 m)   Wt 172 lb (78 kg)   SpO2 98%   BMI 23.33 kg/m  General:   Alert,  pleasant and cooperative in NAD Head:  Normocephalic and atraumatic. Neck:  Supple; Lungs:  Clear throughout to auscultation.    Heart:  Regular rate and rhythm. Abdomen:  Soft, nontender and  nondistended. Normal bowel sounds, without guarding, and without rebound.   Neurologic:  Alert and  oriented x4;  grossly normal neurologically.  Impression/Plan:    SCREENING  Plan:  1. TCS TODAY DISCUSSED PROCEDURE, BENEFITS, & RISKS: < 1% chance of medication reaction, bleeding, perforation, or rupture of spleen/liver.

## 2018-03-07 NOTE — Progress Notes (Signed)
CC'D TO PCP °

## 2018-03-08 NOTE — Progress Notes (Signed)
LMOM to call.

## 2018-03-14 ENCOUNTER — Encounter (HOSPITAL_COMMUNITY): Payer: Self-pay | Admitting: Gastroenterology

## 2019-11-16 ENCOUNTER — Other Ambulatory Visit: Payer: Self-pay

## 2019-11-16 ENCOUNTER — Emergency Department (HOSPITAL_BASED_OUTPATIENT_CLINIC_OR_DEPARTMENT_OTHER): Payer: Commercial Managed Care - PPO

## 2019-11-16 ENCOUNTER — Encounter (HOSPITAL_BASED_OUTPATIENT_CLINIC_OR_DEPARTMENT_OTHER): Payer: Self-pay | Admitting: *Deleted

## 2019-11-16 ENCOUNTER — Emergency Department (HOSPITAL_BASED_OUTPATIENT_CLINIC_OR_DEPARTMENT_OTHER)
Admission: EM | Admit: 2019-11-16 | Discharge: 2019-11-16 | Disposition: A | Discharge status: Home / Self Care | Payer: Commercial Managed Care - PPO | Attending: Emergency Medicine | Admitting: Emergency Medicine

## 2019-11-16 DIAGNOSIS — Z20822 Contact with and (suspected) exposure to covid-19: Secondary | ICD-10-CM | POA: Insufficient documentation

## 2019-11-16 DIAGNOSIS — K529 Noninfective gastroenteritis and colitis, unspecified: Secondary | ICD-10-CM | POA: Insufficient documentation

## 2019-11-16 DIAGNOSIS — R112 Nausea with vomiting, unspecified: Secondary | ICD-10-CM | POA: Diagnosis present

## 2019-11-16 LAB — COMPREHENSIVE METABOLIC PANEL
ALT: 16 U/L (ref 0–44)
AST: 20 U/L (ref 15–41)
Albumin: 3.6 g/dL (ref 3.5–5.0)
Alkaline Phosphatase: 64 U/L (ref 38–126)
Anion gap: 9 (ref 5–15)
BUN: 19 mg/dL (ref 8–23)
CO2: 23 mmol/L (ref 22–32)
Calcium: 8.7 mg/dL — ABNORMAL LOW (ref 8.9–10.3)
Chloride: 100 mmol/L (ref 98–111)
Creatinine, Ser: 1.04 mg/dL (ref 0.61–1.24)
GFR calc Af Amer: >60 mL/min (ref >60)
GFR calc non Af Amer: >60 mL/min (ref >60)
Glucose, Bld: 102 mg/dL — ABNORMAL HIGH (ref 70–99)
Potassium: 3.4 mmol/L — ABNORMAL LOW (ref 3.5–5.1)
Sodium: 132 mmol/L — ABNORMAL LOW (ref 135–145)
Total Bilirubin: 0.4 mg/dL (ref 0.3–1.2)
Total Protein: 7.4 g/dL (ref 6.5–8.1)

## 2019-11-16 LAB — CBC WITH DIFFERENTIAL/PLATELET
Abs Immature Granulocytes: 0.03 10*3/uL (ref 0.00–0.07)
Basophils Absolute: 0 10*3/uL (ref 0.0–0.1)
Basophils Relative: 1 %
Eosinophils Absolute: 0 10*3/uL (ref 0.0–0.5)
Eosinophils Relative: 0 %
HCT: 47.1 % (ref 39.0–52.0)
Hemoglobin: 15.9 g/dL (ref 13.0–17.0)
Immature Granulocytes: 0 %
Lymphocytes Relative: 14 %
Lymphs Abs: 1.1 10*3/uL (ref 0.7–4.0)
MCH: 32.1 pg (ref 26.0–34.0)
MCHC: 33.8 g/dL (ref 30.0–36.0)
MCV: 95 fL (ref 80.0–100.0)
Monocytes Absolute: 1.1 10*3/uL — ABNORMAL HIGH (ref 0.1–1.0)
Monocytes Relative: 14 %
Neutro Abs: 5.2 10*3/uL (ref 1.7–7.7)
Neutrophils Relative %: 71 %
Platelets: 241 10*3/uL (ref 150–400)
RBC: 4.96 MIL/uL (ref 4.22–5.81)
RDW: 12.1 % (ref 11.5–15.5)
WBC: 7.4 10*3/uL (ref 4.0–10.5)
nRBC: 0 % (ref 0.0–0.2)

## 2019-11-16 LAB — LIPASE, BLOOD: Lipase: 25 U/L (ref 11–51)

## 2019-11-16 LAB — URINALYSIS, ROUTINE W REFLEX MICROSCOPIC
Bilirubin Urine: NEGATIVE
Glucose, UA: NEGATIVE mg/dL
Hgb urine dipstick: NEGATIVE
Ketones, ur: NEGATIVE mg/dL
Leukocytes,Ua: NEGATIVE
Nitrite: NEGATIVE
Protein, ur: NEGATIVE mg/dL
Specific Gravity, Urine: 1.025 (ref 1.005–1.030)
pH: 6 (ref 5.0–8.0)

## 2019-11-16 LAB — SARS CORONAVIRUS 2 AG (30 MIN TAT): SARS Coronavirus 2 Ag: NEGATIVE

## 2019-11-16 MED ORDER — METRONIDAZOLE 500 MG PO TABS: 500.0000 mg | ORAL_TABLET | Freq: Three times a day (TID) | ORAL | 0 refills | Status: DC

## 2019-11-16 MED ORDER — ONDANSETRON 4 MG PO TBDP: 4.0000 mg | ORAL_TABLET | Freq: Three times a day (TID) | ORAL | 0 refills | Status: DC | PRN

## 2019-11-16 MED ORDER — CIPROFLOXACIN HCL 500 MG PO TABS: 500.0000 mg | ORAL_TABLET | Freq: Two times a day (BID) | ORAL | 0 refills | Status: DC

## 2019-11-16 MED ORDER — POTASSIUM CHLORIDE CRYS ER 20 MEQ PO TBCR
40.0000 meq | EXTENDED_RELEASE_TABLET | Freq: Once | ORAL | Status: AC
  Administered 2019-11-16: 40 meq via ORAL
  Filled 2019-11-16: qty 2

## 2019-11-16 MED ORDER — ONDANSETRON HCL 4 MG/2ML IJ SOLN
4.0000 mg | Freq: Once | INTRAMUSCULAR | Status: AC
  Administered 2019-11-16: 12:00:00 4 mg via INTRAVENOUS
  Filled 2019-11-16: qty 2

## 2019-11-16 MED ORDER — SODIUM CHLORIDE 0.9 % IV BOLUS
1000.0000 mL | Freq: Once | INTRAVENOUS | Status: AC
  Administered 2019-11-16: 12:00:00 1000 mL via INTRAVENOUS

## 2019-11-16 MED ORDER — IOHEXOL 300 MG/ML  SOLN
100.0000 mL | Freq: Once | INTRAMUSCULAR | Status: AC | PRN
  Administered 2019-11-16: 100 mL via INTRAVENOUS

## 2019-11-16 MED FILL — metroNIDAZOLE 500 MG TABS: 500 | 7 days supply | Qty: 21 | Fill #0

## 2019-11-16 MED FILL — CIPROFLOXACIN HCL 500 MG TA: 500 | 7 days supply | Qty: 14 | Fill #0

## 2019-11-16 MED FILL — ONDANSETRON ODT 4 MG TABLET: 4 | 3 days supply | Qty: 8 | Fill #0

## 2019-11-16 NOTE — ED Notes (Signed)
Pt transported to CT ?

## 2019-11-16 NOTE — Discharge Instructions (Signed)
Start Cipro and Flagyl for colitis (antibiotics). Take with small amount of food Take Zofran as needed for nausea or vomiting Eat foods rich in potassium since levels were low here Drink plenty of fluids Follow up with your doctor or return if you are worsening

## 2019-11-16 NOTE — ED Triage Notes (Signed)
N/V/D since Monday afternoon

## 2019-11-16 NOTE — ED Provider Notes (Signed)
[facility] Provider Note   CSN: [id number] Arrival date & time: 11/16/19  1106     History Chief Complaint  Patient presents with  . Nausea  . Emesis  . Diarrhea    Jon Calderon is a 64 y.o. male who presents with N/V/D.  Patient states that on Monday he started to have recurrent nausea, vomiting, and multiple episodes of diarrhea.  He estimates he has been having 2-3 episodes of watery diarrhea every hour.  Stool is green in color.  He has been running a low-grade fever of 100.8 at home.  He is also having a throbbing headache, fatigue, generalized weakness.  States that the nausea is a little bit better today but he is having persistent diarrhea and fatigue.  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  Denies any recent hospitalizations or antibiotic use.  [place] and takes care of cows, horses, pigs.  Per chart review pt had a colonoscopy in 2019 which showed polyps and hemorrhoids.  No prior abdominal surgeries.  No URI symptoms, chest pain, shortness of breath, cough.  Urine has been decreased.  HPI     Past Medical History:  Diagnosis Date  . Actinic keratoses     Patient Active Problem List   Diagnosis Date Noted  . Special screening for malignant neoplasms, colon   . Multiple actinic keratoses 12/21/2017  . 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 COLONOSCOPY N/A 02/28/2018   Procedure: COLONOSCOPY;  Surgeon: Danie Binder, MD;  Location: [facility];  Service: Endoscopy;  Laterality: N/A;  12:00  . FRACTURE SURGERY     wrist, arm, ribs, foot  . VASECTOMY         Family History  Problem Relation Age of Onset  . Pancreatic cancer Mother 40  . Heart disease Father 10  . Healthy Sister   . Healthy Brother   . Healthy Son   . Cancer Maternal Grandfather   . Colon cancer Neg Hx   . Breast cancer Neg Hx   . Ovarian cancer Neg Hx   . Prostate  cancer Neg Hx     Social History   Tobacco Use  . Smoking status: Never Smoker  . Smokeless tobacco: Never Used  Substance Use Topics  . Alcohol use: Never  . Drug use: Never    Home Medications Prior to Admission medications   Not on File    Allergies    Penicillins  Review of Systems   Review of Systems  Constitutional: Positive for activity change, fatigue and fever.  HENT: Negative for rhinorrhea and sore throat.   Respiratory: Negative for cough and shortness of breath.   Cardiovascular: Negative for chest pain.  Gastrointestinal: Positive for abdominal pain, diarrhea, nausea and vomiting.  Genitourinary: Positive for decreased urine volume. Negative for difficulty urinating, dysuria and frequency.  Neurological: Positive for headaches.  All other systems reviewed and are negative.   Physical Exam Updated Vital Signs BP 125/89 (BP Location: Right Arm)   Pulse 74   Temp 98.4 F (36.9 C) (Oral)   Resp 16   Ht 6' (1.829 m)   Wt 76.2 kg   SpO2 100%   BMI 22.78 kg/m   Physical Exam Vitals and nursing note reviewed.  Constitutional:      General: He is not in acute distress.    Appearance: Normal appearance. He  is well-developed. He is not ill-appearing.     Comments: Calm and cooperative.  No acute distress.  Fatigued appearing  HENT:     Head: Normocephalic and atraumatic.  Eyes:     General: No scleral icterus.       Right eye: No discharge.        Left eye: No discharge.     Conjunctiva/sclera: Conjunctivae normal.     Pupils: Pupils are equal, round, and reactive to light.  Cardiovascular:     Rate and Rhythm: Normal rate and regular rhythm.  Pulmonary:     Effort: Pulmonary effort is normal. No respiratory distress.     Breath sounds: Normal breath sounds.  Abdominal:     General: There is no distension.     Palpations: Abdomen is soft.     Tenderness: There is no abdominal tenderness.  Musculoskeletal:     Cervical back: Normal range of  motion.  Skin:    General: Skin is warm and dry.  Neurological:     Mental Status: He is alert and oriented to person, place, and time.  Psychiatric:        Behavior: Behavior normal.     ED Results / Procedures / Treatments   Labs (all labs ordered are listed, but only abnormal results are displayed) Labs Reviewed  CBC WITH DIFFERENTIAL/PLATELET - Abnormal; Notable for the following components:      Result Value   Monocytes Absolute 1.1 (*)    All other components within normal limits  COMPREHENSIVE METABOLIC PANEL - Abnormal; Notable for the following components:   Sodium 132 (*)    Potassium 3.4 (*)    Glucose, Bld 102 (*)    Calcium 8.7 (*)    All other components within normal limits  SARS CORONAVIRUS 2 AG (30 MIN TAT)  GI PATHOGEN PANEL BY PCR, STOOL  C DIFFICILE QUICK SCREEN W PCR REFLEX  LIPASE, BLOOD  URINALYSIS, ROUTINE W REFLEX MICROSCOPIC    EKG None  Radiology CT Abdomen Pelvis W Contrast  Result Date: 11/16/2019 CLINICAL DATA:  Nausea and vomiting. Lower abdominal pain. EXAM: CT ABDOMEN AND PELVIS WITH CONTRAST TECHNIQUE: Multidetector CT imaging of the abdomen and pelvis was performed using the standard protocol following bolus administration of intravenous contrast. CONTRAST:  174mL OMNIPAQUE IOHEXOL 300 MG/ML  SOLN COMPARISON:  None. FINDINGS: Lower chest: Minor dependent atelectasis in the lung bases. Heart is normal in size. Hepatobiliary: 12 mm low-density in the left hepatic dome is likely cyst, too small to accurately characterize. No suspicious hepatic lesion. Gallbladder physiologically distended, no calcified stone. No biliary dilatation. Pancreas: No ductal dilatation or inflammation. Spleen: Normal in size without focal abnormality. Adrenals/Urinary Tract: No adrenal nodule. No hydronephrosis or perinephric edema. Homogeneous renal enhancement with symmetric excretion on delayed phase imaging. Multiple right renal cysts, including clustered cyst in  the anterior mid kidney, largest measuring 2.3 cm. No evidence of focal solid lesion. Urinary bladder is physiologically distended without wall thickening. An enhancing 13 mm nodule at the central base of the urinary bladder is likely contiguous with the prostate. Stomach/Bowel: Mild diffuse colonic wall thickening from the cecum to the rectum, most prominent in the right colon. Faint pericolonic edema about the ascending colon. Normal appendix. The terminal ileum is normal. No small bowel obstruction or inflammation. Nondistended stomach. Vascular/Lymphatic: No enlarged lymph nodes in the abdomen or pelvis. Normal caliber abdominal aorta. Minimal aortic atherosclerosis. Circumaortic left renal vein. Portal vein is patent. Mesenteric vessels are patent. Reproductive:  Prominent prostate gland spans 5.4 cm. Enhancing 13 mm nodule at the prostate apex projects into the bladder base. Other: Tiny fat containing umbilical hernia. No free air or free fluid in the abdomen or pelvis. No intra-abdominal abscess. Musculoskeletal: There are no acute or suspicious osseous abnormalities. Degenerative change in the lower lumbar spine with primarily facet hypertrophy. IMPRESSION: 1. Mild diffuse colonic wall thickening from the cecum to the rectum, most prominent in the right colon. Findings suspicious for colitis, likely infectious or inflammatory. 2. Enhancing 13 mm nodule in the central bladder base is likely contiguous with the prostate, and likely a prostate nodule. Prominent sized prostate gland. Recommend correlation with PSA and correlation with physical exam. 3. Incidental right renal cysts. Aortic Atherosclerosis (ICD10-I70.0). Electronically Signed   By: Keith Rake M.D.   On: 11/16/2019 13:25    Procedures Procedures (including critical care time)  Medications Ordered in ED Medications  sodium chloride 0.9 % bolus 1,000 mL (0 mLs Intravenous Stopped 11/16/19 1328)  ondansetron (ZOFRAN) injection 4 mg (4 mg  Intravenous Given 11/16/19 1156)  iohexol (OMNIPAQUE) 300 MG/ML solution 100 mL (100 mLs Intravenous Contrast Given 11/16/19 1257)  potassium chloride SA (KLOR-CON) CR tablet 40 mEq (40 mEq Oral Given 11/16/19 1402)    ED Course  I have reviewed the triage vital signs and the nursing notes.  Pertinent labs & imaging results that were available during my care of the patient were reviewed by me and considered in my medical decision making (see chart for details).  64 year old male presents with nausea, vomiting, diarrhea for the past 4 days.  Symptoms are slowly improving but are still persistent.  On exam he is fatigued appearing.  Heart is regular rate and rhythm.  Lungs are clear to auscultation.  Abdomen is soft and nontender.  Differential includes gastroenteritis/colitis, gastritis, viral infection such as Covid, appendicitis, diverticulitis.  Will obtain labs, urine, CT abdomen pelvis.  Will give a liter of fluids.  CBC is normal.  CMP is remarkable for mild hyponatremia and hypokalemia.  Kidney function is normal.  Lipase is normal.  Urine is negative.  POC COVID is negative. CT abdomen pelvis shows diffuse colitis.  Patient was unable to give a stool sample here.  Treat empirically with Cipro and Flagyl.  He is given a dose of potassium and tolerated well.  Give him prescription for Zofran as well and advised follow-up with his primary care doctor or return if worsening.  MDM Rules/Calculators/A&P                       Final Clinical Impression(s) / ED Diagnoses Final diagnoses:  Colitis    Rx / DC Orders ED Discharge Orders    None       Recardo Evangelist, PA-C 11/16/19 [place], [place], DO 11/17/19 0725

## 2021-10-22 IMAGING — CT CT ABD-PELV W/ CM
2 of 5 series · 15 of 46 positions shown, 17 images · IV contrast (omnipaque)
Comparison: None.

CLINICAL DATA: Nausea and vomiting. Lower abdominal pain.

EXAM:
CT ABDOMEN AND PELVIS WITH CONTRAST
TECHNIQUE: Multidetector CT imaging of the abdomen and pelvis was performed
using the standard protocol following bolus administration of
intravenous contrast.
CONTRAST:  100mL OMNIPAQUE IOHEXOL 300 MG/ML  SOLN

[Series 2: axial st · axial · 0.81mm/px · z∈[-603,-123]mm · 12 of 110 slices shown, 14 images]
[im 7/110  soft-tissue]
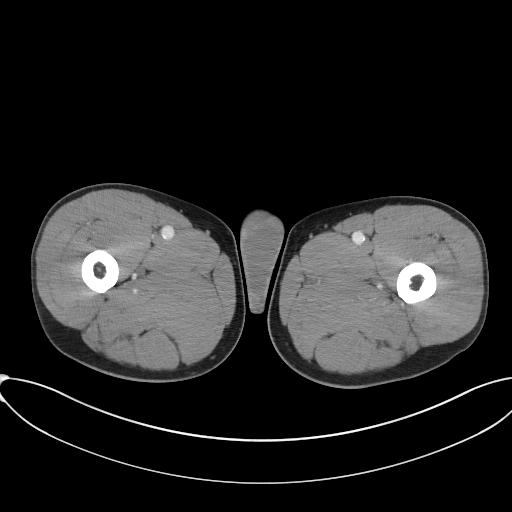
[im 7/110  bone]
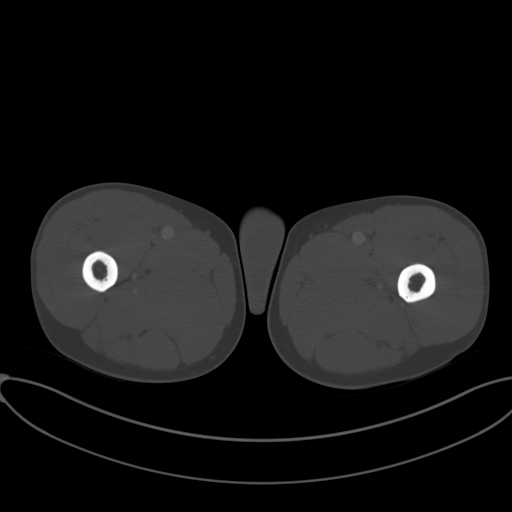
[im 19/110  soft-tissue]
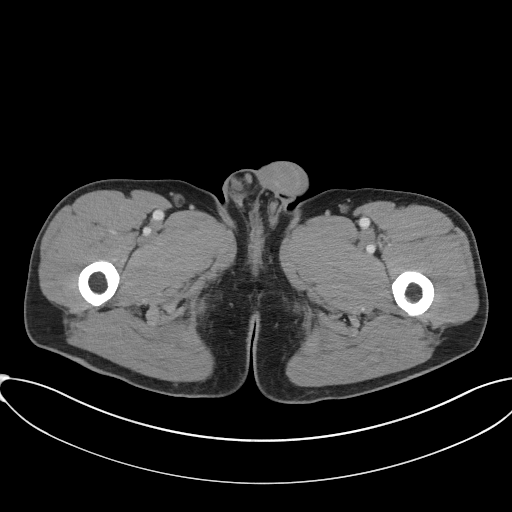
[im 25/110  soft-tissue]
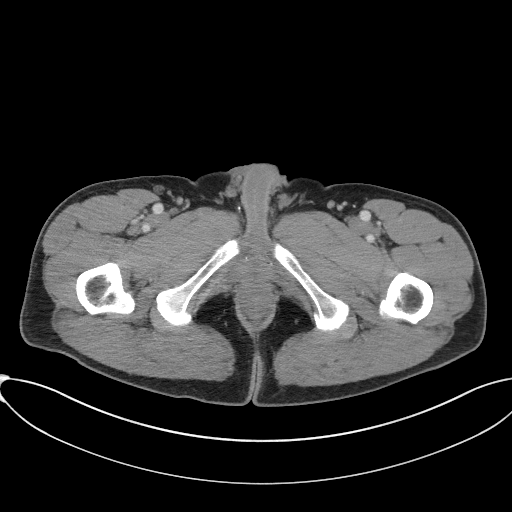
[im 31/110  soft-tissue]
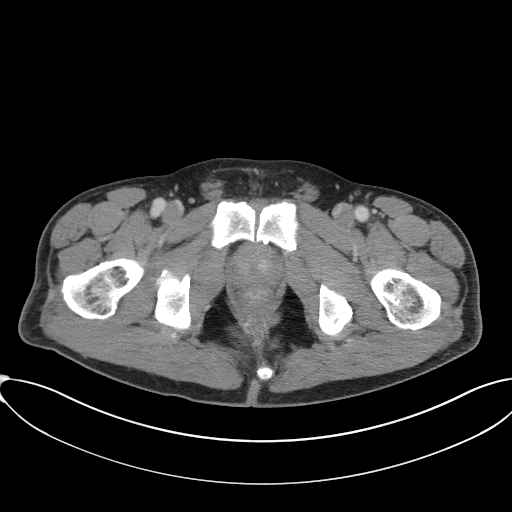
[im 43/110  soft-tissue]
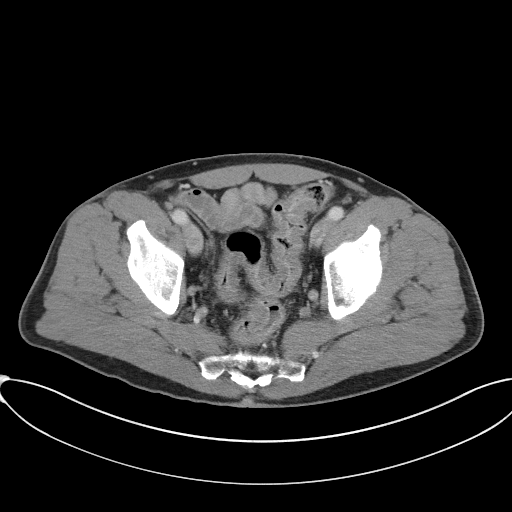
[im 49/110  soft-tissue]
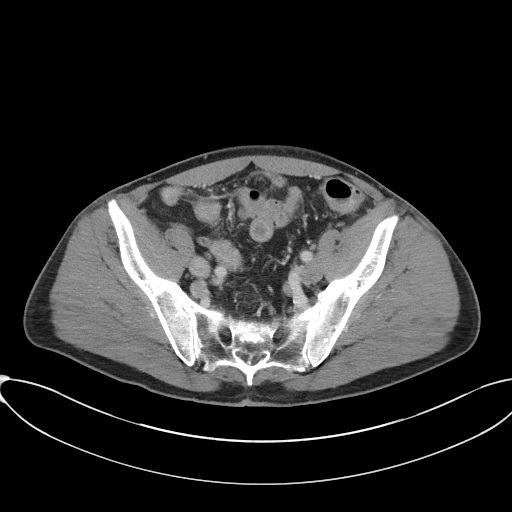
[im 61/110  soft-tissue]
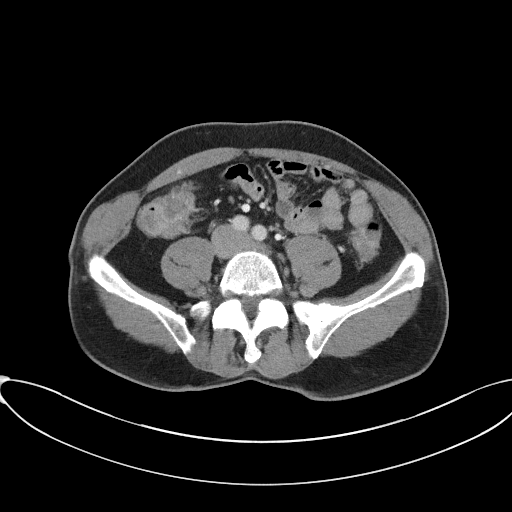
[im 67/110  soft-tissue]
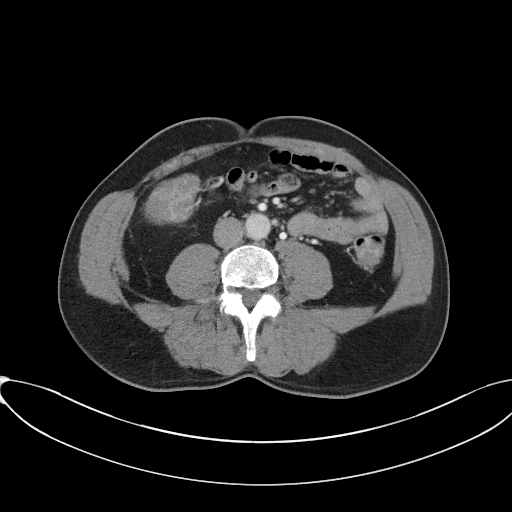
[im 79/110  soft-tissue]
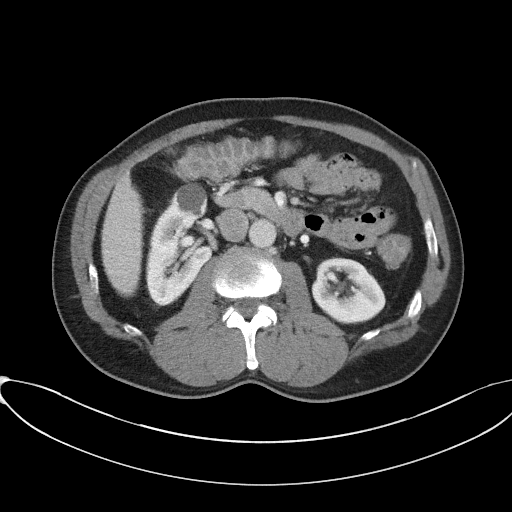
[im 79/110  bone]
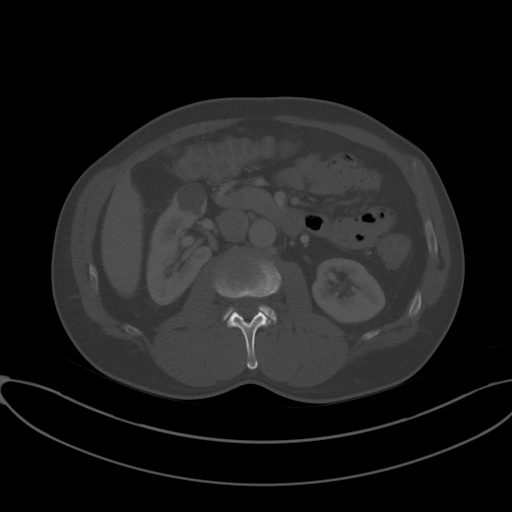
[im 85/110  soft-tissue]
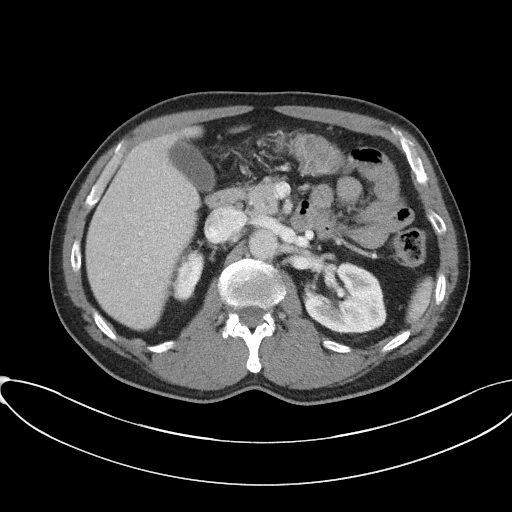
[im 91/110  soft-tissue]
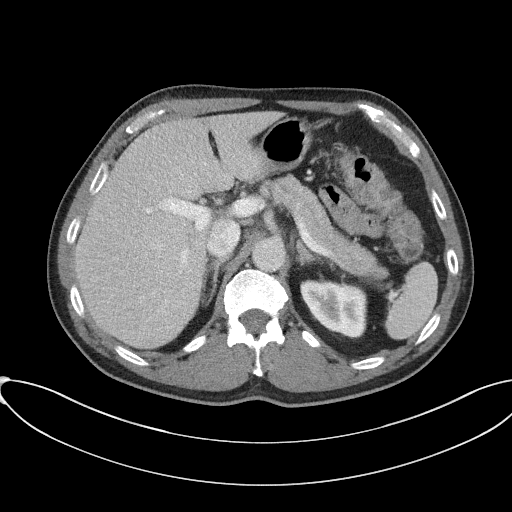
[im 103/110  soft-tissue]
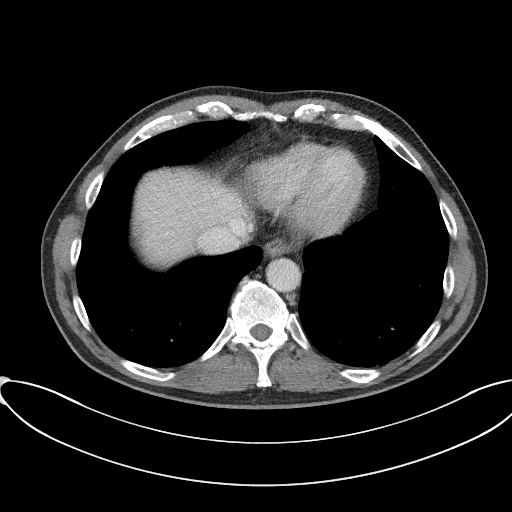

[Series 5: coronal st · coronal · 0.81mm/px · 3 of 89 slices shown]
[im 30/89  soft-tissue]
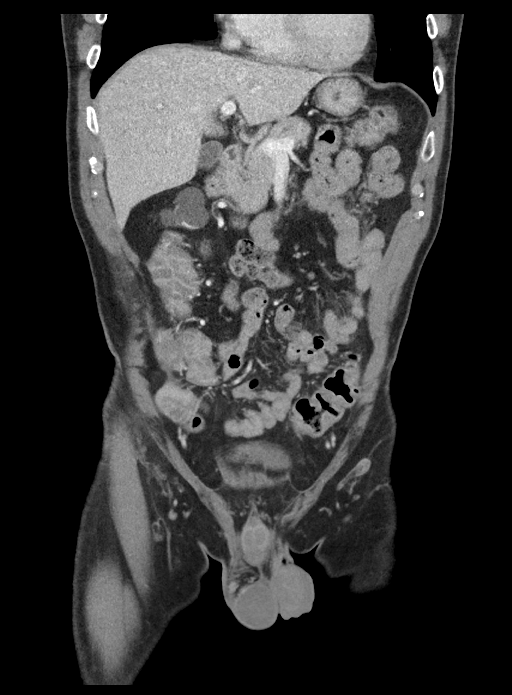
[im 40/89  soft-tissue]
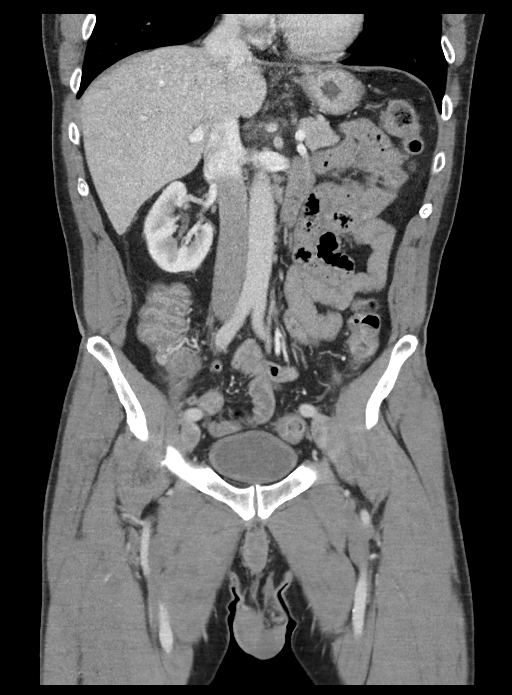
[im 49/89  soft-tissue]
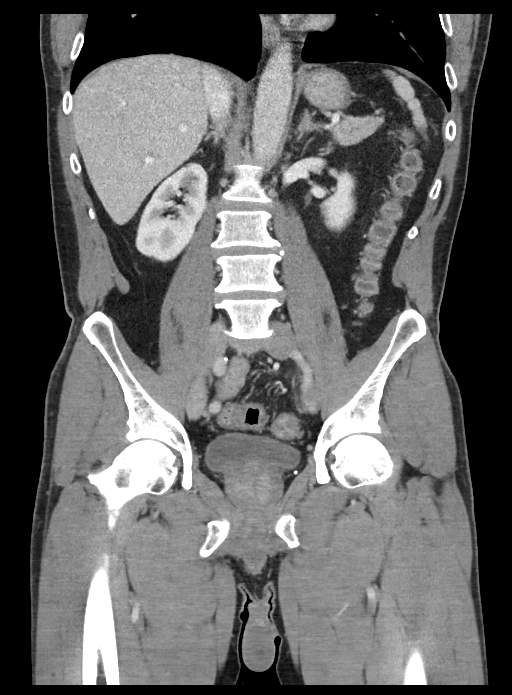

[15 of 46 positions shown; findings below may reference images not displayed]

FINDINGS: Lower chest: Minor dependent atelectasis in the lung bases. Heart is
normal in size.

Hepatobiliary: 12 mm low-density in the left hepatic dome is likely
cyst, too small to accurately characterize. No suspicious hepatic
lesion. Gallbladder physiologically distended, no calcified stone.
No biliary dilatation.

Pancreas: No ductal dilatation or inflammation.

Spleen: Normal in size without focal abnormality.

Adrenals/Urinary Tract: No adrenal nodule. No hydronephrosis or
perinephric edema. Homogeneous renal enhancement with symmetric
excretion on delayed phase imaging. Multiple right renal cysts,
including clustered cyst in the anterior mid kidney, largest
measuring 2.3 cm. No evidence of focal solid lesion. Urinary bladder
is physiologically distended without wall thickening. An enhancing
13 mm nodule at the central base of the urinary bladder is likely
contiguous with the prostate.

Stomach/Bowel: Mild diffuse colonic wall thickening from the cecum
to the rectum, most prominent in the right colon. Faint pericolonic
edema about the ascending colon. Normal appendix. The terminal ileum
is normal. No small bowel obstruction or inflammation. Nondistended
stomach.

Vascular/Lymphatic: No enlarged lymph nodes in the abdomen or
pelvis. Normal caliber abdominal aorta. Minimal aortic
atherosclerosis. Circumaortic left renal vein. Portal vein is
patent. Mesenteric vessels are patent.

Reproductive: Prominent prostate gland spans 5.4 cm. Enhancing 13 mm
nodule at the prostate apex projects into the bladder base.

Other: Tiny fat containing umbilical hernia. No free air or free
fluid in the abdomen or pelvis. No intra-abdominal abscess.

Musculoskeletal: There are no acute or suspicious osseous
abnormalities. Degenerative change in the lower lumbar spine with
primarily facet hypertrophy.
IMPRESSION: 1. Mild diffuse colonic wall thickening from the cecum to the
rectum, most prominent in the right colon. Findings suspicious for
colitis, likely infectious or inflammatory.
2. Enhancing 13 mm nodule in the central bladder base is likely
contiguous with the prostate, and likely a prostate nodule.
Prominent sized prostate gland. Recommend correlation with PSA and
correlation with physical exam.
3. Incidental right renal cysts.

Aortic Atherosclerosis ([id number]).

## 2023-02-02 ENCOUNTER — Encounter: Payer: Self-pay | Admitting: *Deleted

## 2023-04-16 ENCOUNTER — Encounter (HOSPITAL_BASED_OUTPATIENT_CLINIC_OR_DEPARTMENT_OTHER): Payer: Self-pay

## 2023-04-16 ENCOUNTER — Emergency Department (HOSPITAL_BASED_OUTPATIENT_CLINIC_OR_DEPARTMENT_OTHER)
Admission: EM | Admit: 2023-04-16 | Discharge: 2023-04-16 | Disposition: A | Discharge status: Home / Self Care | Payer: Commercial Managed Care - PPO | Attending: Emergency Medicine | Admitting: Emergency Medicine

## 2023-04-16 ENCOUNTER — Other Ambulatory Visit: Payer: Self-pay

## 2023-04-16 ENCOUNTER — Emergency Department (HOSPITAL_BASED_OUTPATIENT_CLINIC_OR_DEPARTMENT_OTHER): Payer: Commercial Managed Care - PPO

## 2023-04-16 DIAGNOSIS — R079 Chest pain, unspecified: Secondary | ICD-10-CM | POA: Insufficient documentation

## 2023-04-16 DIAGNOSIS — M546 Pain in thoracic spine: Secondary | ICD-10-CM | POA: Insufficient documentation

## 2023-04-16 LAB — CBC WITH DIFFERENTIAL/PLATELET
Abs Immature Granulocytes: 0.05 10*3/uL (ref 0.00–0.07)
Basophils Absolute: 0 10*3/uL (ref 0.0–0.1)
Basophils Relative: 1 %
Eosinophils Absolute: 0.2 10*3/uL (ref 0.0–0.5)
Eosinophils Relative: 3 %
HCT: 42.3 % (ref 39.0–52.0)
Hemoglobin: 14.6 g/dL (ref 13.0–17.0)
Immature Granulocytes: 1 %
Lymphocytes Relative: 25 %
Lymphs Abs: 1.9 10*3/uL (ref 0.7–4.0)
MCH: 31.7 pg (ref 26.0–34.0)
MCHC: 34.5 g/dL (ref 30.0–36.0)
MCV: 91.8 fL (ref 80.0–100.0)
Monocytes Absolute: 0.8 10*3/uL (ref 0.1–1.0)
Monocytes Relative: 10 %
Neutro Abs: 4.7 10*3/uL (ref 1.7–7.7)
Neutrophils Relative %: 60 %
Platelets: 243 10*3/uL (ref 150–400)
RBC: 4.61 MIL/uL (ref 4.22–5.81)
RDW: 12.2 % (ref 11.5–15.5)
WBC: 7.7 10*3/uL (ref 4.0–10.5)
nRBC: 0 % (ref 0.0–0.2)

## 2023-04-16 LAB — COMPREHENSIVE METABOLIC PANEL
ALT: 18 U/L (ref 0–44)
AST: 20 U/L (ref 15–41)
Albumin: 4.1 g/dL (ref 3.5–5.0)
Alkaline Phosphatase: 61 U/L (ref 38–126)
Anion gap: 8 (ref 5–15)
BUN: 20 mg/dL (ref 8–23)
CO2: 27 mmol/L (ref 22–32)
Calcium: 9.2 mg/dL (ref 8.9–10.3)
Chloride: 104 mmol/L (ref 98–111)
Creatinine, Ser: 0.83 mg/dL (ref 0.61–1.24)
GFR, Estimated: >60 mL/min (ref >60)
Glucose, Bld: 109 mg/dL — ABNORMAL HIGH (ref 70–99)
Potassium: 3.8 mmol/L (ref 3.5–5.1)
Sodium: 139 mmol/L (ref 135–145)
Total Bilirubin: 0.4 mg/dL (ref 0.3–1.2)
Total Protein: 7.3 g/dL (ref 6.5–8.1)

## 2023-04-16 LAB — TROPONIN I (HIGH SENSITIVITY)
Troponin I (High Sensitivity): 5 ng/L (ref <18)
Troponin I (High Sensitivity): 5 ng/L (ref <18)

## 2023-04-16 LAB — LIPASE, BLOOD: Lipase: 33 U/L (ref 11–51)

## 2023-04-16 LAB — D-DIMER, QUANTITATIVE: D-Dimer, Quant: 0.49 ug/mL-FEU (ref 0.00–0.50)

## 2023-04-16 MED ORDER — CYCLOBENZAPRINE HCL 5 MG PO TABS: 5.0000 mg | ORAL_TABLET | Freq: Three times a day (TID) | ORAL | 0 refills | Status: AC | PRN

## 2023-04-16 MED ORDER — LIDOCAINE 5 % EX PTCH: 1.0000 | MEDICATED_PATCH | CUTANEOUS | 0 refills | Status: AC

## 2023-04-16 NOTE — ED Provider Notes (Signed)
Received handoff from Felicita Gage PA, 3 days pain from R back pain to chest. Pleuritic in nature. Reproducible pain. Does heavy lifting.   Physical Exam  BP 119/78   Pulse (!) 57   Temp 97.7 F (36.5 C) (Oral)   Resp 18   Ht 6' (1.829 m)   Wt 76 kg   SpO2 98%   BMI 22.72 kg/m   Physical Exam Vitals and nursing note reviewed.  Constitutional:      General: He is not in acute distress.    Appearance: He is well-developed.  HENT:     Head: Normocephalic and atraumatic.  Eyes:     Conjunctiva/sclera: Conjunctivae normal.  Cardiovascular:     Rate and Rhythm: Normal rate and regular rhythm.     Heart sounds: No murmur heard. Pulmonary:     Effort: Pulmonary effort is normal. No respiratory distress.     Breath sounds: Normal breath sounds.  Abdominal:     Palpations: Abdomen is soft.     Tenderness: There is no abdominal tenderness.  Musculoskeletal:        General: No swelling.     Cervical back: Neck supple.     Comments: Tenderness to palpation of right shoulder, and chest wall.  No crepitus, erythema, or rash.  Skin:    General: Skin is warm and dry.     Capillary Refill: Capillary refill takes less than 2 seconds.  Neurological:     Mental Status: He is alert.  Psychiatric:        Mood and Affect: Mood normal.     Procedures  Procedures  ED Course / MDM    Medical Decision Making Patient is a 67 year old male, received handoff from [facility], regarding pleuritic chest pain, that is reproducible.  He does lift heavy items.  His D-dimer was less than 0.5, and his troponins were within normal limits x 2.  He is overall well-appearing, refused medication for treatment on exam.  He has reproducible chest pain, he is not hypertensive or hypotensive, and of the pain is worse on exam, and palpation and then with range of motion.  I believe that likely has a costochondritis or muscle strain, which is causing this from the heavy lifting.  His wife is in agreement with me,  and I sent him home on Flexeril, lidocaine patches and instructed him to take ibuprofen 800 mg 3 times a day.  Strict return precautions were emphasized.  Amount and/or Complexity of Data Reviewed Labs: ordered. Radiology: ordered.  Risk Prescription drug management.         Pete Pelt, PA 04/16/23 2313    Elayne Snare K, DO 04/23/23 [phone]

## 2023-04-16 NOTE — ED Provider Notes (Signed)
[facility] Provider Note   CSN: [id number] Arrival date & time: 04/16/23  1553     History  Chief Complaint  Patient presents with   Back Pain    Jon Calderon is a 67 y.o. male.  Patient with no significant past medical history --presents to the emergency department today for evaluation of back and chest pain.  Symptoms started about 3 days ago.  This is described as a sharp pain, like a knife that goes from his right mid back through to his chest.  It is worse by certain movements and by palpation.  It hurts worse when he takes a deep breath.  He does not take medications and has not tried anything for this.  He tried to mow the yard today on a riding mower, but over certain bumps, the pain was so bad of for like he was going to pass out.  No preceding injury.  Symptoms have gradually worsened since onset.  It has affected his activity.  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  He does travel long distances, sometimes to [place] or [place]. Patient denies risk factors for pulmonary embolism including: unilateral leg swelling, history of DVT/PE/other blood clots, use of exogenous hormones, recent immobilizations, recent surgery, recent travel (>4hr segment), malignancy, hemoptysis.  No fevers or cough.       Home Medications Prior to Admission medications   Medication Sig Start Date End Date Taking? Authorizing Provider  ciprofloxacin (CIPRO) 500 MG tablet Take 1 tablet (500 mg total) by mouth every 12 (twelve) hours. 11/16/19   Bethel Born, PA-C  metroNIDAZOLE (FLAGYL) 500 MG tablet Take 1 tablet (500 mg total) by mouth 3 (three) times daily. 11/16/19   Bethel Born, PA-C  ondansetron (ZOFRAN ODT) 4 MG disintegrating tablet Take 1 tablet (4 mg total) by mouth every 8 (eight) hours as needed for nausea or vomiting. 11/16/19   Bethel Born, PA-C      Allergies    Penicillins    Review of Systems   Review of Systems  Physical Exam Updated Vital Signs BP (!) 142/74 (BP Location: Right Arm)   Pulse 72   Temp 97.7 F (36.5 C) (Oral)   Resp 16   Ht 6' (1.829 m)   Wt 76 kg   SpO2 98%   BMI 22.72 kg/m  Physical Exam Vitals and nursing note reviewed.  Constitutional:      Appearance: He is well-developed. He is not diaphoretic.  HENT:     Head: Normocephalic and atraumatic.     Mouth/Throat:     Mouth: Mucous membranes are not dry.  Eyes:     Conjunctiva/sclera: Conjunctivae normal.  Neck:     Vascular: Normal carotid pulses. No carotid bruit or JVD.     Trachea: Trachea normal. No tracheal deviation.  Cardiovascular:     Rate and Rhythm: Normal rate and regular rhythm.     Pulses: No decreased pulses.          Radial pulses are 2+ on the right side and 2+ on the left side.     Heart sounds: Normal heart sounds, S1 normal and S2 normal. Heart sounds not distant. No murmur heard. Pulmonary:     Effort: Pulmonary effort is normal. No respiratory distress.     Breath sounds: Normal breath sounds. No wheezing.  Chest:  Chest wall: No tenderness.  Abdominal:     General: Bowel sounds are normal.     Palpations: Abdomen is soft.     Tenderness: There is no abdominal tenderness. There is no guarding or rebound.  Musculoskeletal:     Cervical back: Normal range of motion and neck supple. No tenderness. No muscular tenderness. Normal range of motion.     Thoracic back: Tenderness present. No spasms. Normal range of motion.     Lumbar back: No tenderness. Normal range of motion.       Back:     Right lower leg: No edema.     Left lower leg: No edema.     Comments: Focal tenderness and patient winces when I press just inferior to the scapula on the right side, just lateral to the spine.  Skin:    General: Skin is warm and dry.     Coloration: Skin is not pale.  Neurological:     Mental Status: He is  alert. Mental status is at baseline.  Psychiatric:        Mood and Affect: Mood normal.     ED Results / Procedures / Treatments   Labs (all labs ordered are listed, but only abnormal results are displayed) Labs Reviewed - No data to display  EKG EKG Interpretation Date/Time:  Friday April 16 2023 16:09:29 EDT Ventricular Rate:  70 PR Interval:  164 QRS Duration:  114 QT Interval:  393 QTC Calculation: 424 R Axis:   84  Text Interpretation: Sinus rhythm Probable inferior infarct, old No significant change since last tracing Confirmed by Elayne Snare ([id number]) on 04/16/2023 5:47:03 PM  Radiology No results found.  Procedures Procedures    Medications Ordered in ED Medications - No data to display  ED Course/ Medical Decision Making/ A&P    Patient seen and examined. History obtained directly from patient.   Labs/EKG: Ordered CBC, CMP, lipase, troponin and D-dimer.  EKG personally reviewed and interpreted, inferior Q waves noted.  Imaging: Ordered chest x-ray  Medications/Fluids: Ordered: None ordered  Most recent vital signs reviewed and are as follows: BP (!) 142/74 (BP Location: Right Arm)   Pulse 72   Temp 97.7 F (36.5 C) (Oral)   Resp 16   Ht 6' (1.829 m)   Wt 76 kg   SpO2 98%   BMI 22.72 kg/m   Initial impression: Patient with reproducible back pain but some pleuritic symptoms.  He does not go to a doctor often.  Will rule out cardiac causes as well.  6:50 PM  Labs personally reviewed and interpreted including: CBC unremarkable and D-dimer is normal.  Imaging personally visualized and interpreted including: Chest x-ray agree negative  Reviewed pertinent lab work and imaging with patient at bedside. Questions answered.   Most current vital signs reviewed and are as follows: BP 119/78   Pulse (!) 57   Temp 97.7 F (36.5 C) (Oral)   Resp 18   Ht 6' (1.829 m)   Wt 76 kg   SpO2 98%   BMI 22.72 kg/m   Plan: Patient discussed with Small PA-C  at shift change.  Currently awaiting remainder of lab workup including troponin, CMP, lipase.  If workup remains reassuring, would consider treatment of MSK type pain.  Consider anti-inflammatories, Tylenol, muscle relaxer, Lidoderm patch with rest.  Medical Decision Making Amount and/or Complexity of Data Reviewed Labs: ordered. Radiology: ordered.   Awaiting completion of workup.  Patient with reproducible back pain with radiation into the chest.  He does do a lot of heavy lifting and driving.  D-dimer is negative and patient felt to be low risk [place].  No clinical signs of DVT.  Vital signs are reassuring.  Awaiting troponin and chemistries.  Overall low concern for dissection, aneurysm, ACS.        Final Clinical Impression(s) / ED Diagnoses Final diagnoses:  Acute right-sided thoracic back pain    Rx / DC Orders ED Discharge Orders     None         Renne Crigler, PA-C 04/16/23 1852    Rexford Maus, DO 04/16/23 [id number]

## 2023-04-16 NOTE — ED Triage Notes (Addendum)
Patient having pain around his right shoulder blade that moves into the right side of his chest for three days. He denied injury. Pain is worse with movement and when taking a deep breath. He denied shortness of breath.

## 2023-04-16 NOTE — Discharge Instructions (Addendum)
Please follow-up with your primary care doctor, I believe that your pain may be secondary to inflamed nerve in your rib cage, versus a muscle strain.  You can take ibuprofen 800 mg 3 times a day, for your pain control.  Return to the ER if you feel like your symptoms are getting worse.  Or you become very short of breath.  Make sure you are resting, and taking time off work.

## 2024-02-17 ENCOUNTER — Emergency Department (HOSPITAL_BASED_OUTPATIENT_CLINIC_OR_DEPARTMENT_OTHER)
Admission: EM | Admit: 2024-02-17 | Discharge: 2024-02-17 | Disposition: A | Discharge status: Home / Self Care | Attending: Emergency Medicine | Admitting: Emergency Medicine

## 2024-02-17 ENCOUNTER — Other Ambulatory Visit: Payer: Self-pay

## 2024-02-17 ENCOUNTER — Encounter (HOSPITAL_BASED_OUTPATIENT_CLINIC_OR_DEPARTMENT_OTHER): Payer: Self-pay | Admitting: Emergency Medicine

## 2024-02-17 ENCOUNTER — Emergency Department (HOSPITAL_BASED_OUTPATIENT_CLINIC_OR_DEPARTMENT_OTHER)

## 2024-02-17 DIAGNOSIS — S32019A Unspecified fracture of first lumbar vertebra, initial encounter for closed fracture: Secondary | ICD-10-CM | POA: Insufficient documentation

## 2024-02-17 DIAGNOSIS — M545 Low back pain, unspecified: Secondary | ICD-10-CM

## 2024-02-17 DIAGNOSIS — S32010A Wedge compression fracture of first lumbar vertebra, initial encounter for closed fracture: Secondary | ICD-10-CM

## 2024-02-17 DIAGNOSIS — S3992XA Unspecified injury of lower back, initial encounter: Secondary | ICD-10-CM | POA: Diagnosis present

## 2024-02-17 MED ORDER — METHOCARBAMOL 500 MG PO TABS: 500.0000 mg | ORAL_TABLET | Freq: Two times a day (BID) | ORAL | 0 refills | Status: AC

## 2024-02-17 MED ORDER — KETOROLAC TROMETHAMINE 15 MG/ML IJ SOLN
15.0000 mg | Freq: Once | INTRAMUSCULAR | Status: AC
  Administered 2024-02-17: 15 mg via INTRAMUSCULAR
  Filled 2024-02-17: qty 1

## 2024-02-17 NOTE — Discharge Instructions (Signed)
 I would have you continue taking your medicines as you have been since it has been working for you.  The x-ray looks like you have a compression fracture of your L1 vertebra.  Please follow-up with your family doctor for this.  If you have continued pain then they may consider sending you to physical therapy or more advanced imaging or referral to a spinal surgeon.  I did prescribe you a muscle relaxant that you can take on top of those other medications.  Sometimes they can actually make a big difference.  Your back pain is most likely due to a muscular strain.  There is been a lot of research on back pain, unfortunately the only thing that seems to really help is Tylenol and ibuprofen.  Relative rest is also important to not lift greater than 10 pounds bending or twisting at the waist.  Please follow-up with your family physician.  The other thing that really seems to benefit patients is physical therapy which your doctor may send you for.  Please return to the emergency department for new numbness or weakness to your arms or legs. Difficulty with urinating or urinating or pooping on yourself.  Also if you cannot feel toilet paper when you wipe or get a fever.
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
